# Patient Record
Sex: Male | Born: 1935 | Race: White | Hispanic: No | Smoking: Former smoker
Health system: Southern US, Community
[De-identification: ages and names within clinical notes are randomized; demographics above are authoritative.]

## PROBLEM LIST (undated history)

## (undated) DIAGNOSIS — I10 Essential (primary) hypertension: Secondary | ICD-10-CM

## (undated) HISTORY — PX: APPENDECTOMY: SHX54

## (undated) HISTORY — PX: PROSTATECTOMY: SHX69

## (undated) HISTORY — PX: TONSILLECTOMY: SUR1361

## (undated) HISTORY — DX: Essential (primary) hypertension: I10

---

## 2013-05-30 DIAGNOSIS — H9190 Unspecified hearing loss, unspecified ear: Secondary | ICD-10-CM

## 2013-05-30 DIAGNOSIS — C61 Malignant neoplasm of prostate: Secondary | ICD-10-CM | POA: Diagnosis present

## 2013-05-30 DIAGNOSIS — I639 Cerebral infarction, unspecified: Secondary | ICD-10-CM | POA: Diagnosis present

## 2014-02-20 DIAGNOSIS — R627 Adult failure to thrive: Secondary | ICD-10-CM | POA: Diagnosis present

## 2015-02-18 DIAGNOSIS — R42 Dizziness and giddiness: Secondary | ICD-10-CM | POA: Diagnosis not present

## 2015-02-18 DIAGNOSIS — R001 Bradycardia, unspecified: Secondary | ICD-10-CM | POA: Diagnosis not present

## 2015-02-18 DIAGNOSIS — D72829 Elevated white blood cell count, unspecified: Secondary | ICD-10-CM | POA: Diagnosis not present

## 2015-02-18 DIAGNOSIS — I10 Essential (primary) hypertension: Secondary | ICD-10-CM | POA: Diagnosis not present

## 2015-02-18 DIAGNOSIS — D649 Anemia, unspecified: Secondary | ICD-10-CM | POA: Diagnosis not present

## 2015-02-18 DIAGNOSIS — R55 Syncope and collapse: Secondary | ICD-10-CM | POA: Diagnosis not present

## 2015-02-18 DIAGNOSIS — R002 Palpitations: Secondary | ICD-10-CM | POA: Diagnosis not present

## 2015-02-19 DIAGNOSIS — I10 Essential (primary) hypertension: Secondary | ICD-10-CM | POA: Diagnosis not present

## 2015-02-19 DIAGNOSIS — D649 Anemia, unspecified: Secondary | ICD-10-CM | POA: Diagnosis not present

## 2015-02-19 DIAGNOSIS — R42 Dizziness and giddiness: Secondary | ICD-10-CM | POA: Diagnosis not present

## 2015-02-19 DIAGNOSIS — R001 Bradycardia, unspecified: Secondary | ICD-10-CM | POA: Diagnosis not present

## 2015-05-10 DIAGNOSIS — H93291 Other abnormal auditory perceptions, right ear: Secondary | ICD-10-CM | POA: Diagnosis not present

## 2015-05-10 DIAGNOSIS — H6123 Impacted cerumen, bilateral: Secondary | ICD-10-CM | POA: Diagnosis not present

## 2015-07-04 DIAGNOSIS — Z733 Stress, not elsewhere classified: Secondary | ICD-10-CM | POA: Diagnosis not present

## 2015-07-04 DIAGNOSIS — I10 Essential (primary) hypertension: Secondary | ICD-10-CM | POA: Diagnosis not present

## 2015-07-17 DIAGNOSIS — F411 Generalized anxiety disorder: Secondary | ICD-10-CM | POA: Diagnosis not present

## 2015-07-17 DIAGNOSIS — I10 Essential (primary) hypertension: Secondary | ICD-10-CM | POA: Diagnosis not present

## 2016-01-16 DIAGNOSIS — M7501 Adhesive capsulitis of right shoulder: Secondary | ICD-10-CM | POA: Diagnosis not present

## 2016-01-16 DIAGNOSIS — I10 Essential (primary) hypertension: Secondary | ICD-10-CM | POA: Diagnosis not present

## 2016-01-16 DIAGNOSIS — M19019 Primary osteoarthritis, unspecified shoulder: Secondary | ICD-10-CM | POA: Diagnosis not present

## 2016-01-16 DIAGNOSIS — R6889 Other general symptoms and signs: Secondary | ICD-10-CM | POA: Diagnosis not present

## 2016-02-11 DIAGNOSIS — D649 Anemia, unspecified: Secondary | ICD-10-CM | POA: Diagnosis not present

## 2016-02-24 DIAGNOSIS — J069 Acute upper respiratory infection, unspecified: Secondary | ICD-10-CM | POA: Diagnosis not present

## 2016-02-24 DIAGNOSIS — I10 Essential (primary) hypertension: Secondary | ICD-10-CM | POA: Diagnosis not present

## 2016-02-24 DIAGNOSIS — R05 Cough: Secondary | ICD-10-CM | POA: Diagnosis not present

## 2016-02-24 DIAGNOSIS — R55 Syncope and collapse: Secondary | ICD-10-CM | POA: Diagnosis not present

## 2016-02-24 DIAGNOSIS — Z79899 Other long term (current) drug therapy: Secondary | ICD-10-CM | POA: Diagnosis not present

## 2016-02-25 DIAGNOSIS — R509 Fever, unspecified: Secondary | ICD-10-CM | POA: Diagnosis not present

## 2016-03-24 DIAGNOSIS — D649 Anemia, unspecified: Secondary | ICD-10-CM | POA: Diagnosis not present

## 2016-03-24 DIAGNOSIS — R509 Fever, unspecified: Secondary | ICD-10-CM | POA: Diagnosis not present

## 2016-11-10 DIAGNOSIS — I10 Essential (primary) hypertension: Secondary | ICD-10-CM | POA: Diagnosis not present

## 2016-11-10 DIAGNOSIS — Z23 Encounter for immunization: Secondary | ICD-10-CM | POA: Diagnosis not present

## 2016-11-10 DIAGNOSIS — F411 Generalized anxiety disorder: Secondary | ICD-10-CM | POA: Diagnosis not present

## 2016-11-10 DIAGNOSIS — Z681 Body mass index (BMI) 19 or less, adult: Secondary | ICD-10-CM | POA: Diagnosis not present

## 2016-12-17 DIAGNOSIS — Z681 Body mass index (BMI) 19 or less, adult: Secondary | ICD-10-CM | POA: Diagnosis not present

## 2016-12-17 DIAGNOSIS — R636 Underweight: Secondary | ICD-10-CM | POA: Diagnosis not present

## 2016-12-17 DIAGNOSIS — I10 Essential (primary) hypertension: Secondary | ICD-10-CM | POA: Diagnosis not present

## 2016-12-18 DIAGNOSIS — R040 Epistaxis: Secondary | ICD-10-CM | POA: Diagnosis not present

## 2016-12-18 DIAGNOSIS — Z681 Body mass index (BMI) 19 or less, adult: Secondary | ICD-10-CM | POA: Diagnosis not present

## 2016-12-18 DIAGNOSIS — I10 Essential (primary) hypertension: Secondary | ICD-10-CM | POA: Diagnosis not present

## 2016-12-28 DIAGNOSIS — R040 Epistaxis: Secondary | ICD-10-CM | POA: Diagnosis not present

## 2016-12-28 DIAGNOSIS — J342 Deviated nasal septum: Secondary | ICD-10-CM | POA: Diagnosis not present

## 2017-02-15 DIAGNOSIS — Z681 Body mass index (BMI) 19 or less, adult: Secondary | ICD-10-CM | POA: Diagnosis not present

## 2017-02-15 DIAGNOSIS — Z1389 Encounter for screening for other disorder: Secondary | ICD-10-CM | POA: Diagnosis not present

## 2017-02-15 DIAGNOSIS — R040 Epistaxis: Secondary | ICD-10-CM | POA: Diagnosis not present

## 2017-02-15 DIAGNOSIS — I10 Essential (primary) hypertension: Secondary | ICD-10-CM | POA: Diagnosis not present

## 2017-03-17 DIAGNOSIS — I10 Essential (primary) hypertension: Secondary | ICD-10-CM | POA: Diagnosis not present

## 2017-03-17 DIAGNOSIS — D539 Nutritional anemia, unspecified: Secondary | ICD-10-CM | POA: Diagnosis not present

## 2017-03-17 DIAGNOSIS — R609 Edema, unspecified: Secondary | ICD-10-CM | POA: Diagnosis not present

## 2017-03-17 DIAGNOSIS — D649 Anemia, unspecified: Secondary | ICD-10-CM | POA: Diagnosis not present

## 2017-03-17 DIAGNOSIS — R7309 Other abnormal glucose: Secondary | ICD-10-CM | POA: Diagnosis not present

## 2017-03-17 DIAGNOSIS — R42 Dizziness and giddiness: Secondary | ICD-10-CM | POA: Diagnosis not present

## 2017-04-01 DIAGNOSIS — I10 Essential (primary) hypertension: Secondary | ICD-10-CM | POA: Diagnosis not present

## 2017-04-01 DIAGNOSIS — R609 Edema, unspecified: Secondary | ICD-10-CM | POA: Diagnosis not present

## 2017-04-19 DIAGNOSIS — D649 Anemia, unspecified: Secondary | ICD-10-CM | POA: Diagnosis not present

## 2017-07-05 DIAGNOSIS — H9190 Unspecified hearing loss, unspecified ear: Secondary | ICD-10-CM | POA: Diagnosis not present

## 2017-07-05 DIAGNOSIS — D649 Anemia, unspecified: Secondary | ICD-10-CM | POA: Diagnosis not present

## 2017-07-05 DIAGNOSIS — R42 Dizziness and giddiness: Secondary | ICD-10-CM | POA: Diagnosis not present

## 2017-07-05 DIAGNOSIS — I10 Essential (primary) hypertension: Secondary | ICD-10-CM | POA: Diagnosis not present

## 2017-07-05 DIAGNOSIS — R55 Syncope and collapse: Secondary | ICD-10-CM | POA: Diagnosis not present

## 2017-07-14 ENCOUNTER — Encounter: Payer: Self-pay | Admitting: Neurology

## 2017-07-14 ENCOUNTER — Ambulatory Visit (INDEPENDENT_AMBULATORY_CARE_PROVIDER_SITE_OTHER): Payer: Medicare Other | Admitting: Neurology

## 2017-07-14 VITALS — BP 181/74 | HR 71 | Ht 65.0 in | Wt 126.0 lb

## 2017-07-14 DIAGNOSIS — R030 Elevated blood-pressure reading, without diagnosis of hypertension: Secondary | ICD-10-CM | POA: Diagnosis not present

## 2017-07-14 DIAGNOSIS — R42 Dizziness and giddiness: Secondary | ICD-10-CM

## 2017-07-14 NOTE — Progress Notes (Signed)
Subjective:    Patient ID: [name] is a 84 y.o. male.  HPI     Star Age, MD, PhD [facility] [address] [place], [place] [address]  Dear Dr. Orland Mustard,   I saw your patient, Tyler Liu, upon your kind request in my neurologic clinic today for initial consultation of his lightheadedness and presyncopal spells. The patient is unaccompanied today. As you know, Tyler Liu is a 84 year old right-handed gentleman with an underlying medical history of hypertension, hearing loss and anemia, who reports episodes of lightheadedness and spinning sensation recently. Symptoms are intermittent, started when he moved to [place] from [place] about 5 months ago. Sadly, he lost his wife about 5 months ago, she was in memory care. He had moved into assisted living to be close to her, there were no spots in independent living he explains. He has been tracking his blood pressure at home. He brings in the record today. He has had fluctuating blood pressure values but diastolic numbers are typically in the higher 50s and pulse rate has been in the higher in mid 50s typically. He reports difficulty with lightheadedness and spinning sensation especially if he tries to move his head quickly or his eyes quickly and also when he tries to play the piano. He has been under a lot of stress. I reviewed your office note from 07/05/2017. He had blood work through your office which I reviewed including TSH, CMP, CBC with differential. Labs were unremarkable with the exception of hemoglobin of 12.5 and hematocrit of 37.7. He had an EKG in your office. He had a low pulse rate in your office at 45. He has lower diastolic blood pressure values in the 50s. He recently moved from [place]. He is widowed, nonsmoker, drinks alcohol rarely, does not typically consume any caffeine on a regular basis.  Patient has been upset as he was waiting a long time in our lobby and also had gone  to the wrong entrance initially when coming here. He found the instructions to get to our clinic confusing. He has not had an eye exam in some time, unclear how long. He does not want to stay in [place] as nothing keeps him here. He has no children. He does not overtly endorse depression but is clearly upset. He states at one point that he may go home and take about 40 pills of his clonidine and go to sleep and not wake up again. I did ask him about suicidal intent and suicidal ideations. Patient said that he was joking and that he would not take his clonidine like that. He does not even take it as needed typically. He has as needed Valium but does not take it typically. He was trying to see a [employer], I believe for an eye exam, but found the directions too confusing. He denies any one-sided weakness or numbness or tingling or headaches. No recent falls.  His Past Medical History Is Significant For: Past Medical History:  Diagnosis Date  . Hypertension     His Past Surgical History Is Significant For:   His Family History Is Significant For: No family history on file.  His Social History Is Significant For: Social History   Socioeconomic History  . Marital status: Widowed    Spouse name: Not on file  . Number of children: Not on file  . Years of education: Not on file  . Highest education level: Not on file  Occupational History  .  Not on file  Social Needs  . Financial resource strain: Not on file  . Food insecurity:    Worry: Not on file    Inability: Not on file  . Transportation needs:    Medical: Not on file    Non-medical: Not on file  Tobacco Use  . Smoking status: Former Research scientist (life sciences)  . Smokeless tobacco: Never Used  Substance and Sexual Activity  . Alcohol use: Yes  . Drug use: Never  . Sexual activity: Not on file  Lifestyle  . Physical activity:    Days per week: Not on file    Minutes per session: Not on file  . Stress: Not on file  Relationships  . Social  connections:    Talks on phone: Not on file    Gets together: Not on file    Attends religious service: Not on file    Active member of club or organization: Not on file    Attends meetings of clubs or organizations: Not on file    Relationship status: Not on file  Other Topics Concern  . Not on file  Social History Narrative  . Not on file    His Allergies Are:  Allergies  Allergen Reactions  . Aspirin   :   His Current Medications Are:  Outpatient Encounter Medications as of 07/14/2017  Medication Sig  . amLODipine (NORVASC) 5 MG tablet Take 5 mg by mouth daily.  Marland Kitchen atenolol (TENORMIN) 50 MG tablet Take 50 mg by mouth daily.  . Cholecalciferol (VITAMIN D3) 1000 units CAPS Take 1,000 Units by mouth daily.  . cloNIDine (CATAPRES) 0.1 MG tablet Take 0.1 mg by mouth as needed.   . Coenzyme Q10 60 MG TABS Take by mouth.  . diazePAM (VALIUM PO) Take by mouth.  Marland Kitchen GARLIC PO Take by mouth.  . Ginger, Zingiber officinalis, (GINGER ROOT PO) Take by mouth.  . Multiple Vitamins-Minerals (ICAPS AREDS FORMULA PO) Take by mouth.  . Omega 3 1200 MG CAPS Take by mouth.  . vitamin B-12 (CYANOCOBALAMIN) 1000 MCG tablet Take 1,000 mcg by mouth daily.   No facility-administered encounter medications on file as of 07/14/2017.   : Review of Systems:  Out of a complete 14 point review of systems, all are reviewed and negative with the exception of these symptoms as listed below:  Review of Systems  Neurological:       Pt presents today to discuss his dizziness. Pt gets dizzy when he plays the piano. Pt could not tolerate lying down to obtain orthostatic blood pressure today.     Objective:  Neurological Exam  Physical Exam Physical Examination:   Vitals:   07/14/17 1454  BP: (!) 181/74  Pulse: 71   Could not tolerate orthostatic testing and would not lie down.  Upon standing, BP and pulse about the same.  Later rechecked showed elevated BP again.   General Examination: The patient is  a 84 y.o. male, he had to wait for this appointment and is visibly upset and says so. He is well groomed, thin and frail appearing. He voiced his frustration repeatedly.   HEENT: Normocephalic, atraumatic, pupils are equal, round and reactive to light and accommodation. S/p cataract repairs. Extraocular tracking is good without limitation to gaze excursion or nystagmus noted. Normal smooth pursuit is noted. Hearing is grossly intact. Face is symmetric with normal facial animation and normal facial sensation. Speech is clear with no dysarthria noted. There is no hypophonia. There is no lip, neck/head, jaw  or voice tremor. Oropharynx exam reveals: mild mouth dryness, adequate dental hygiene. Tongue protrudes centrally and palate elevates symmetrically.   Chest: Clear to auscultation without wheezing, rhonchi or crackles noted.  Heart: S1+S2+0, regular and normal without murmurs, rubs or gallops noted.   Abdomen: Soft, non-tender and non-distended with normal bowel sounds appreciated on auscultation.  Extremities: There is 1+ to 2+ pitting edema in the distal lower extremities bilaterally, around the ankles, L more than R.  Skin: Warm and dry without trophic changes noted.  Musculoskeletal: exam reveals no obvious joint deformities, tenderness or joint swelling or erythema.   Neurologically:  Mental status: The patient is awake, alert and oriented in all 4 spheres. His immediate and remote memory, attention, language skills and fund of knowledge are appropriate. There is no evidence of aphasia, agnosia, apraxia or anomia. Speech is clear with normal prosody and enunciation. Thought process is linear. Mood is constricted and affect is constricted.  Cranial nerves II - XII are as described above under HEENT exam. In addition: shoulder shrug is normal with equal shoulder height noted. Motor exam: Normal bulk, strength and tone is noted. There is no drift, tremor or rebound. Romberg is negative. Reflexes  are 1+, absent in the ankles. Fine motor skills and coordination: grossly intact.  Cerebellar testing: No dysmetria or intention tremor. There is no truncal or gait ataxia.  Sensory exam: intact to light touch in the upper and lower extremities.  Gait, station and balance: He stands with difficulty and pushes himself, posture mildly stooped. Gait shows cautious and slow gait, preserved arm swing.               Assessment and Plan:    In summary, Tyler Liu is a very pleasant 84 y.o.-year old male with an underlying medical history of hypertension, hearing loss and anemia, who presents for neurologic consultation of his intermittent dizzy spells, some lightheadedness reported, some vertiginous spells, none of which could be reproduced in clinic today. He was visibly upset and unhappy with having to wait and could not go through with the orthostatic vital signs. His BP is elevated repeatedly, he does not have any focal neurological signs or symptoms. He will not be able to tolerate an MRI as he indicates that he cannot lay still for any prolonged period of time. He states, he could not tolerate the EEG laying down in your office.he may benefit from evaluation through cardiology as he may have bradycardia episodes as his pulse rate tends to be in the 50s on his log and also in your office. I suggested we could do a head CT but he would like to discuss this with you first. He will follow-up with me on an as-needed basis. He indicated frustration with life and stress. He does not endorse frank depression or suicidal ideations but may benefit from counseling or therapy. He may benefit from physical therapy. It sounds like he is participating in some form of yoga at his assisted living facility. Unfortunately, I did not have any further suggestions for him. I encouraged him to stay well-hydrated. I encouraged him to use his cane. He has a cane that he uses in the mornings typically. He also held onto his wife's  walker and wheelchair but does not typically use them. At this juncture, he will make a follow-up appointment with you. He is encouraged to see an eye doctor for an updated eye exam. I will see him back as needed. I answered all his questions today  and he was in agreement.   Thank you very much for allowing me to participate in the care of this nice patient. If I can be of any further assistance to you please do not hesitate to call me at [phone].  Sincerely,   Star Age, MD, PhD

## 2017-07-14 NOTE — Patient Instructions (Signed)
I am sorry you feel bad and dizzy.  Unfortunately, dizziness is a very common complaint but is often not due to a primary neurological reason or single underlying medical problem. Often, there a combination of factors, that result in dizziness. This includes blood pressure fluctuations, medication side effects, blood sugar fluctuations, stress, vertigo, poor sleep with sleep deprivation, dehydration, and electrolyte disturbance or other metabolic and endocrinological reasons, meaning hormone related problems such as thyroid dysfunction. We can investigate things further with a head CT. You can discuss this with Dr. Orland Mustard first.  A brain MRI would require that you lie flat for an extended period of time. You may benefit from seeing a cardiologist as your pulse rate may drop.  You may benefit from physical therapy, talk to Dr. Orland Mustard about it. I will see you back as needed.

## 2017-10-23 DIAGNOSIS — I10 Essential (primary) hypertension: Secondary | ICD-10-CM | POA: Diagnosis not present

## 2017-11-30 DIAGNOSIS — H35033 Hypertensive retinopathy, bilateral: Secondary | ICD-10-CM | POA: Diagnosis not present

## 2017-11-30 DIAGNOSIS — H0100A Unspecified blepharitis right eye, upper and lower eyelids: Secondary | ICD-10-CM | POA: Diagnosis not present

## 2017-11-30 DIAGNOSIS — H52201 Unspecified astigmatism, right eye: Secondary | ICD-10-CM | POA: Diagnosis not present

## 2017-11-30 DIAGNOSIS — Z961 Presence of intraocular lens: Secondary | ICD-10-CM | POA: Diagnosis not present

## 2018-02-09 DIAGNOSIS — H35033 Hypertensive retinopathy, bilateral: Secondary | ICD-10-CM | POA: Diagnosis not present

## 2018-02-09 DIAGNOSIS — H5340 Unspecified visual field defects: Secondary | ICD-10-CM | POA: Diagnosis not present

## 2018-03-22 DIAGNOSIS — I10 Essential (primary) hypertension: Secondary | ICD-10-CM | POA: Diagnosis not present

## 2018-03-22 DIAGNOSIS — R42 Dizziness and giddiness: Secondary | ICD-10-CM | POA: Diagnosis not present

## 2018-03-22 DIAGNOSIS — D649 Anemia, unspecified: Secondary | ICD-10-CM | POA: Diagnosis not present

## 2018-09-21 DIAGNOSIS — R2689 Other abnormalities of gait and mobility: Secondary | ICD-10-CM | POA: Diagnosis not present

## 2018-09-21 DIAGNOSIS — E46 Unspecified protein-calorie malnutrition: Secondary | ICD-10-CM | POA: Diagnosis not present

## 2018-09-21 DIAGNOSIS — D649 Anemia, unspecified: Secondary | ICD-10-CM | POA: Diagnosis not present

## 2018-09-21 DIAGNOSIS — M545 Low back pain: Secondary | ICD-10-CM | POA: Diagnosis not present

## 2018-09-21 DIAGNOSIS — R627 Adult failure to thrive: Secondary | ICD-10-CM | POA: Diagnosis not present

## 2018-09-21 DIAGNOSIS — R7309 Other abnormal glucose: Secondary | ICD-10-CM | POA: Diagnosis not present

## 2018-09-21 DIAGNOSIS — R634 Abnormal weight loss: Secondary | ICD-10-CM | POA: Diagnosis not present

## 2018-09-21 DIAGNOSIS — I10 Essential (primary) hypertension: Secondary | ICD-10-CM | POA: Diagnosis not present

## 2019-01-30 DIAGNOSIS — Z20828 Contact with and (suspected) exposure to other viral communicable diseases: Secondary | ICD-10-CM | POA: Diagnosis not present

## 2019-01-30 DIAGNOSIS — Z1159 Encounter for screening for other viral diseases: Secondary | ICD-10-CM | POA: Diagnosis not present

## 2019-02-06 DIAGNOSIS — Z20828 Contact with and (suspected) exposure to other viral communicable diseases: Secondary | ICD-10-CM | POA: Diagnosis not present

## 2019-02-06 DIAGNOSIS — Z1159 Encounter for screening for other viral diseases: Secondary | ICD-10-CM | POA: Diagnosis not present

## 2019-02-13 DIAGNOSIS — Z20828 Contact with and (suspected) exposure to other viral communicable diseases: Secondary | ICD-10-CM | POA: Diagnosis not present

## 2019-02-13 DIAGNOSIS — Z1159 Encounter for screening for other viral diseases: Secondary | ICD-10-CM | POA: Diagnosis not present

## 2019-04-05 DIAGNOSIS — D649 Anemia, unspecified: Secondary | ICD-10-CM | POA: Diagnosis not present

## 2019-04-05 DIAGNOSIS — R202 Paresthesia of skin: Secondary | ICD-10-CM | POA: Diagnosis not present

## 2019-04-05 DIAGNOSIS — R7309 Other abnormal glucose: Secondary | ICD-10-CM | POA: Diagnosis not present

## 2019-04-05 DIAGNOSIS — I10 Essential (primary) hypertension: Secondary | ICD-10-CM | POA: Diagnosis not present

## 2019-04-05 DIAGNOSIS — M549 Dorsalgia, unspecified: Secondary | ICD-10-CM | POA: Diagnosis not present

## 2019-10-02 DIAGNOSIS — Z1159 Encounter for screening for other viral diseases: Secondary | ICD-10-CM | POA: Diagnosis not present

## 2019-10-02 DIAGNOSIS — Z20828 Contact with and (suspected) exposure to other viral communicable diseases: Secondary | ICD-10-CM | POA: Diagnosis not present

## 2019-12-01 ENCOUNTER — Other Ambulatory Visit: Payer: Self-pay

## 2019-12-01 ENCOUNTER — Emergency Department (HOSPITAL_COMMUNITY): Payer: Medicare Other

## 2019-12-01 ENCOUNTER — Emergency Department (HOSPITAL_COMMUNITY)
Admission: EM | Admit: 2019-12-01 | Discharge: 2019-12-01 | Disposition: A | Discharge status: Home / Self Care | Payer: Medicare Other | Attending: Emergency Medicine | Admitting: Emergency Medicine

## 2019-12-01 ENCOUNTER — Encounter (HOSPITAL_COMMUNITY): Payer: Self-pay | Admitting: Emergency Medicine

## 2019-12-01 DIAGNOSIS — M545 Low back pain, unspecified: Secondary | ICD-10-CM | POA: Insufficient documentation

## 2019-12-01 DIAGNOSIS — L89159 Pressure ulcer of sacral region, unspecified stage: Secondary | ICD-10-CM | POA: Diagnosis not present

## 2019-12-01 DIAGNOSIS — K5730 Diverticulosis of large intestine without perforation or abscess without bleeding: Secondary | ICD-10-CM | POA: Diagnosis not present

## 2019-12-01 DIAGNOSIS — Z87891 Personal history of nicotine dependence: Secondary | ICD-10-CM | POA: Diagnosis not present

## 2019-12-01 DIAGNOSIS — I10 Essential (primary) hypertension: Secondary | ICD-10-CM | POA: Diagnosis not present

## 2019-12-01 DIAGNOSIS — K8689 Other specified diseases of pancreas: Secondary | ICD-10-CM | POA: Diagnosis not present

## 2019-12-01 DIAGNOSIS — M549 Dorsalgia, unspecified: Secondary | ICD-10-CM

## 2019-12-01 DIAGNOSIS — R10819 Abdominal tenderness, unspecified site: Secondary | ICD-10-CM | POA: Diagnosis not present

## 2019-12-01 DIAGNOSIS — N3289 Other specified disorders of bladder: Secondary | ICD-10-CM | POA: Diagnosis not present

## 2019-12-01 DIAGNOSIS — Z79899 Other long term (current) drug therapy: Secondary | ICD-10-CM | POA: Diagnosis not present

## 2019-12-01 LAB — COMPREHENSIVE METABOLIC PANEL
ALT: 10 U/L (ref 0–44)
AST: 16 U/L (ref 15–41)
Albumin: 4.2 g/dL (ref 3.5–5.0)
Alkaline Phosphatase: 74 U/L (ref 38–126)
Anion gap: 10 (ref 5–15)
BUN: 26 mg/dL — ABNORMAL HIGH (ref 8–23)
CO2: 28 mmol/L (ref 22–32)
Calcium: 10.3 mg/dL (ref 8.9–10.3)
Chloride: 100 mmol/L (ref 98–111)
Creatinine, Ser: 0.99 mg/dL (ref 0.61–1.24)
GFR, Estimated: >60 mL/min (ref >60)
Glucose, Bld: 112 mg/dL — ABNORMAL HIGH (ref 70–99)
Potassium: 4.1 mmol/L (ref 3.5–5.1)
Sodium: 138 mmol/L (ref 135–145)
Total Bilirubin: 0.8 mg/dL (ref 0.3–1.2)
Total Protein: 7.9 g/dL (ref 6.5–8.1)

## 2019-12-01 LAB — CBC WITH DIFFERENTIAL/PLATELET
Abs Immature Granulocytes: 0.04 10*3/uL (ref 0.00–0.07)
Basophils Absolute: 0 10*3/uL (ref 0.0–0.1)
Basophils Relative: 0 %
Eosinophils Absolute: 0.2 10*3/uL (ref 0.0–0.5)
Eosinophils Relative: 2 %
HCT: 36.5 % — ABNORMAL LOW (ref 39.0–52.0)
Hemoglobin: 11.9 g/dL — ABNORMAL LOW (ref 13.0–17.0)
Immature Granulocytes: 0 %
Lymphocytes Relative: 17 %
Lymphs Abs: 1.8 10*3/uL (ref 0.7–4.0)
MCH: 29.9 pg (ref 26.0–34.0)
MCHC: 32.6 g/dL (ref 30.0–36.0)
MCV: 91.7 fL (ref 80.0–100.0)
Monocytes Absolute: 0.9 10*3/uL (ref 0.1–1.0)
Monocytes Relative: 8 %
Neutro Abs: 7.9 10*3/uL — ABNORMAL HIGH (ref 1.7–7.7)
Neutrophils Relative %: 73 %
Platelets: 223 10*3/uL (ref 150–400)
RBC: 3.98 MIL/uL — ABNORMAL LOW (ref 4.22–5.81)
RDW: 14 % (ref 11.5–15.5)
WBC: 10.8 10*3/uL — ABNORMAL HIGH (ref 4.0–10.5)
nRBC: 0 % (ref 0.0–0.2)

## 2019-12-01 LAB — URINALYSIS, ROUTINE W REFLEX MICROSCOPIC
Bacteria, UA: NONE SEEN
Bilirubin Urine: NEGATIVE
Glucose, UA: NEGATIVE mg/dL
Hgb urine dipstick: NEGATIVE
Ketones, ur: 5 mg/dL — AB
Leukocytes,Ua: NEGATIVE
Nitrite: NEGATIVE
Protein, ur: 100 mg/dL — AB
Specific Gravity, Urine: 1.012 (ref 1.005–1.030)
pH: 6 (ref 5.0–8.0)

## 2019-12-01 MED ORDER — ACETAMINOPHEN 325 MG PO TABS
650.0000 mg | ORAL_TABLET | Freq: Once | ORAL | Status: AC
  Administered 2019-12-01: 650 mg via ORAL
  Filled 2019-12-01: qty 2

## 2019-12-01 MED ORDER — METHYLPREDNISOLONE 4 MG PO TBPK: ORAL_TABLET | ORAL | 0 refills | Status: DC

## 2019-12-01 MED ORDER — DIAZEPAM 5 MG/ML IJ SOLN
2.5000 mg | Freq: Once | INTRAMUSCULAR | Status: AC
  Administered 2019-12-01: 2.5 mg via INTRAVENOUS
  Filled 2019-12-01: qty 2

## 2019-12-01 MED ORDER — LIDOCAINE 5 % EX PTCH
1.0000 | MEDICATED_PATCH | CUTANEOUS | Status: DC
  Administered 2019-12-01: 1 via TRANSDERMAL
  Filled 2019-12-01: qty 1

## 2019-12-01 MED ORDER — MORPHINE SULFATE (PF) 4 MG/ML IV SOLN
4.0000 mg | Freq: Once | INTRAVENOUS | Status: AC
  Administered 2019-12-01: 4 mg via INTRAVENOUS
  Filled 2019-12-01: qty 1

## 2019-12-01 MED ORDER — CYCLOBENZAPRINE HCL 5 MG PO TABS: 10.0000 mg | ORAL_TABLET | Freq: Two times a day (BID) | ORAL | 0 refills | Status: DC | PRN

## 2019-12-01 MED ORDER — KETOROLAC TROMETHAMINE 15 MG/ML IJ SOLN
15.0000 mg | Freq: Once | INTRAMUSCULAR | Status: AC
  Administered 2019-12-01: 15 mg via INTRAVENOUS
  Filled 2019-12-01: qty 1

## 2019-12-01 NOTE — ED Notes (Signed)
Pt in CT at this time.

## 2019-12-01 NOTE — Progress Notes (Signed)
TOC CM/CSW spoke with [facility] about pts return.  [facility] does accept pt back.  [facility] is partnered with [facility] for [facility] [phone], and [place]/PT [phone].  CSW will continue to work with Fuller Mandril, RN to make arrangements.  Nevaen Tredway Tarpley-Carter, MSW, LCSW-A Pronouns:  She, Her, Hers                  [place] ED Transitions of CareClinical Social Worker [email or web address] [phone]

## 2019-12-01 NOTE — TOC Initial Note (Addendum)
Transition of Care [facility]) - Initial/Assessment Note    Patient Details  Name: Tyler Liu MRN: [id number] Date of Birth: March 12, 1922  Transition of Care [facility]) CM/SW Contact:    Tyler Liu, [place] Phone Number: 12/01/2019, 1:45 PM  Clinical Narrative:                 [facility] CM/CSW spoke with pt about his needs when returning to [facility].  Pt stated he would like PT and HH.  CSW informed pt that both CSW CM and RN CM will be working to arrange this.  Tyler Liu, MSW, LCSW-A Pronouns:  She, Her, [place] ED Transitions of CareClinical Social Worker [email or web address] [phone]        Patient Goals and CMS Choice  Arrange PT and [facility] for pts return to [place].      Expected Discharge Plan and Services                                                Prior Living Arrangements/Services  [facility]/ALF                     Activities of Daily Living      Permission Sought/Granted  Pt has given CSW CM and RN CM permission to speak with [facility] and make dc arrangements.                Emotional Assessment  Alert and oriented x 4.            Admission diagnosis:  back pain There are no problems to display for this patient.  PCP:  Patient, No Pcp Per Pharmacy:   [facility], [address] [place] [address] Phone: [phone] Fax: [phone]     Social Determinants of Health (SDOH) Interventions    Readmission Risk Interventions No flowsheet data found.

## 2019-12-01 NOTE — Discharge Instructions (Addendum)
Please take 650 mg of Tylenol every 6 hours as needed for pain as well.  Consider buying 4% lidocaine patches over-the-counter as well to use daily.  Take Flexeril and methylprednisolone as prescribed.

## 2019-12-01 NOTE — ED Provider Notes (Signed)
[facility] DEPT Provider Note   CSN: [id number] Arrival date & time: 12/01/19  1030     History Chief Complaint  Patient presents with  . Back Pain    Right side    Tyler Liu is a 86 y.o. male.  The history is provided by the patient.  Back Pain Location:  Lumbar spine Quality:  Aching and stabbing Pain severity:  Moderate Onset quality:  Gradual Timing:  Intermittent Progression:  Worsening Chronicity:  New Context: not recent illness (hx of kidney stones)   Relieved by:  Nothing Worsened by:  Movement Associated symptoms: no abdominal pain, no abdominal swelling, no bladder incontinence, no bowel incontinence, no chest pain, no dysuria, no fever, no headaches, no leg pain, no numbness, no paresthesias, no pelvic pain, no perianal numbness, no tingling, no weakness and no weight loss        Past Medical History:  Diagnosis Date  . Hypertension     There are no problems to display for this patient.   Past Surgical History:  Procedure Laterality Date  . APPENDECTOMY    . PROSTATECTOMY    . TONSILLECTOMY         History reviewed. No pertinent family history.  Social History   Tobacco Use  . Smoking status: Former Research scientist (life sciences)  . Smokeless tobacco: Never Used  Substance Use Topics  . Alcohol use: Yes  . Drug use: Never    Home Medications Prior to Admission medications   Medication Sig Start Date End Date Taking? Authorizing Provider  amLODipine (NORVASC) 5 MG tablet Take 5 mg by mouth daily.    [provider]  atenolol (TENORMIN) 50 MG tablet Take 50 mg by mouth daily.    [provider]  Cholecalciferol (VITAMIN D3) 1000 units CAPS Take 1,000 Units by mouth daily.    [provider]  cloNIDine (CATAPRES) 0.1 MG tablet Take 0.1 mg by mouth as needed.     [provider]  Coenzyme Q10 60 MG TABS Take by mouth.    [provider]  cyclobenzaprine (FLEXERIL) 5 MG tablet Take 2  tablets (10 mg total) by mouth 2 (two) times daily as needed for up to 20 doses for muscle spasms. 12/01/19   Sholom Dulude, DO  diazePAM (VALIUM PO) Take by mouth.    [provider]  GARLIC PO Take by mouth.    [provider]  Ginger, Zingiber officinalis, (GINGER ROOT PO) Take by mouth.    [provider]  methylPREDNISolone (MEDROL DOSEPAK) 4 MG TBPK tablet Follow package insert 12/01/19   Lanee Chain, DO  Multiple Vitamins-Minerals (ICAPS AREDS FORMULA PO) Take by mouth.    [provider]  Omega 3 1200 MG CAPS Take by mouth.    [provider]  vitamin B-12 (CYANOCOBALAMIN) 1000 MCG tablet Take 1,000 mcg by mouth daily.    [provider]    Allergies    Aspirin  Review of Systems   Review of Systems  Constitutional: Negative for chills, fever and weight loss.  HENT: Negative for ear pain and sore throat.   Eyes: Negative for pain and visual disturbance.  Respiratory: Negative for cough and shortness of breath.   Cardiovascular: Negative for chest pain and palpitations.  Gastrointestinal: Negative for abdominal pain, bowel incontinence and vomiting.  Genitourinary: Negative for bladder incontinence, dysuria, hematuria and pelvic pain.  Musculoskeletal: Positive for back pain. Negative for arthralgias.  Skin: Negative for color change and rash.  Neurological:  Negative for tingling, seizures, syncope, weakness, numbness, headaches and paresthesias.  All other systems reviewed and are negative.   Physical Exam Updated Vital Signs BP (!) 145/59   Pulse 64   Temp 97.7 F (36.5 C) (Oral)   Resp 17   SpO2 94%   Physical Exam Vitals and nursing note reviewed.  Constitutional:      General: He is in acute distress.     Appearance: He is well-developed.  HENT:     Head: Normocephalic and atraumatic.     Nose: Nose normal.     Mouth/Throat:     Mouth: Mucous membranes are moist.  Eyes:     Extraocular Movements:  Extraocular movements intact.     Conjunctiva/sclera: Conjunctivae normal.     Pupils: Pupils are equal, round, and reactive to light.  Cardiovascular:     Rate and Rhythm: Normal rate and regular rhythm.     Pulses: Normal pulses.     Heart sounds: Normal heart sounds. No murmur heard.   Pulmonary:     Effort: Pulmonary effort is normal. No respiratory distress.     Breath sounds: Normal breath sounds.  Abdominal:     Palpations: Abdomen is soft.     Tenderness: There is no abdominal tenderness. There is right CVA tenderness.  Musculoskeletal:        General: Tenderness present. Normal range of motion.     Cervical back: Normal range of motion and neck supple.     Comments: Tenderness to right flank area and right paraspinal lumbar muscles on the right  Skin:    General: Skin is warm and dry.     Capillary Refill: Capillary refill takes less than 2 seconds.  Neurological:     General: No focal deficit present.     Mental Status: He is alert and oriented to person, place, and time.     Cranial Nerves: No cranial nerve deficit.     Sensory: No sensory deficit.     Motor: No weakness.     Coordination: Coordination normal.     Comments: 5+ out of 5 strength throughout, normal sensation     ED Results / Procedures / Treatments   Labs (all labs ordered are listed, but only abnormal results are displayed) Labs Reviewed  CBC WITH DIFFERENTIAL/PLATELET - Abnormal; Notable for the following components:      Result Value   WBC 10.8 (*)    RBC 3.98 (*)    Hemoglobin 11.9 (*)    HCT 36.5 (*)    Neutro Abs 7.9 (*)    All other components within normal limits  COMPREHENSIVE METABOLIC PANEL - Abnormal; Notable for the following components:   Glucose, Bld 112 (*)    BUN 26 (*)    All other components within normal limits  URINALYSIS, ROUTINE W REFLEX MICROSCOPIC - Abnormal; Notable for the following components:   Ketones, ur 5 (*)    Protein, ur 100 (*)    All other components  within normal limits    EKG None  Radiology CT L-SPINE NO CHARGE  Result Date: 12/01/2019 CLINICAL DATA:  Right-sided low back pain for 1 month. Increasing pain with movement. EXAM: CT LUMBAR SPINE WITHOUT CONTRAST TECHNIQUE: Multiplanar CT image reconstructions of the lumbar spine were generated from the data acquired during abdominal CT same date. COMPARISON:  None. FINDINGS: Abdominopelvic CT findings dictated separately. Segmentation: Transitional lumbosacral anatomy. Presumed vestigial right-sided rib at T12 and transitional, partially lumbarized L5 segment. Alignment: Normal. Vertebrae:  The bones are demineralized. No evidence of acute fracture or dislocation. There is endplate degeneration with Schmorl's node formation at L3-4. No endplate destruction. There are syndesmophytes and paraspinal osteophytes throughout the lumbar spine with probable ankylosis of the L4-5 and L5-S1 facet joints. The sacroiliac joints also appear partially ankylosed bilaterally. Paraspinal and other soft tissues: No acute paraspinal findings. Abdominopelvic findings deferred to separate report. Disc levels: L1-2: Mild disc bulging and facet hypertrophy. No significant spinal stenosis. L2-3: Mild disc bulging and bilateral facet hypertrophy. No significant spinal stenosis. L3-4: As above, disc degeneration with prominent Schmorl's node and endplate osteophyte formation. There is a small central partially calcified disc protrusion and mild facet and ligamentous hypertrophy. These factors contribute to mild spinal stenosis and mild asymmetric narrowing of the right lateral recess and right foramen. L4-5: Suspected ankylosis across the disc and facet joints. No significant spinal stenosis. L5-S1: Suspected ankylosis across the disc and facet joints. No significant spinal stenosis. As above, this level is transitional with bilateral lumbosacral assimilation joints. IMPRESSION: 1. No acute lumbar spine findings or explanation  for the patient's symptoms. 2. Transitional lumbosacral anatomy. Suspected ankylosis across the L4-5 and L5-S1 facet joints and sacroiliac joints suggesting ankylosing spondylitis. 3. Multilevel spondylosis as described, greatest at L3-4 where there is mild multifactorial spinal stenosis and mild asymmetric narrowing of the right lateral recess and right foramen. 4. Abdominopelvic CT findings dictated separately. Electronically Signed   By: Richardean Sale M.D.   On: 12/01/2019 12:24   CT Renal Stone Study  Result Date: 12/01/2019 CLINICAL DATA:  Flank pain, suspected kidney stone with RIGHT flank pain EXAM: CT ABDOMEN AND PELVIS WITHOUT CONTRAST TECHNIQUE: Multidetector CT imaging of the abdomen and pelvis was performed following the standard protocol without IV contrast. COMPARISON:  CT lumbar spine same date. FINDINGS: Lower chest: Bilateral Bochdalek's hernias with adjacent atelectasis. No consolidation. No pleural effusion. Hepatobiliary: Normal noncontrast appearance of liver. Cholelithiasis without pericholecystic stranding. Biliary tree not well evaluated, no gross biliary duct distension. Pancreas: Fatty atrophy of much of the pancreas without signs of inflammation. Atrophy is present throughout the entire pancreas. No visible ductal dilation. Spleen: Spleen is normal. Adrenals/Urinary Tract: Adrenal glands are normal. Cystic lesions of the bilateral kidneys most displaying density compatible with simple cysts. The lobulated lesion along the lateral margin of the RIGHT kidney measures 13-14 mm with density just below 30 Hounsfield units, homogeneous density. No sign of hydronephrosis. No perinephric stranding. Urinary bladder moderately distended. Lack of ureteral distension makes ureteral assessment difficult. No gross evidence of ureteral calculi and no secondary signs to suggest ureteral calculi. Stomach/Bowel: Signs of colonic diverticulosis. Mild stranding about the RIGHT colon in the setting of  RIGHT-sided diverticulosis. Appendix not visualized, surgically absent by report. Vascular/Lymphatic: Calcified atheromatous plaque of the abdominal aorta. The 2.7 cm caliber of the infrarenal abdominal aorta is slightly greater than 1.5 times the normal caliber abdominal aorta. Not well evaluated in the absence of contrast. There is no gastrohepatic or hepatoduodenal ligament lymphadenopathy. No retroperitoneal or mesenteric lymphadenopathy. No pelvic sidewall lymphadenopathy. Reproductive: Post prostatectomy. Post pelvic lymphadenectomy. No pelvic mass. Other: Mild stranding overlying the sacrum. No CT evidence of ulceration (image 55 of series 2) Musculoskeletal: Marked osteopenia. Spinal degenerative changes. No acute or destructive bone process. IMPRESSION: 1. Colonic diverticulosis including RIGHT-sided diverticular disease. Query low level inflammatory changes of the RIGHT colon which could represent mild diverticulitis. 2. No hydronephrosis or nephrolithiasis. No periureteral stranding to suggest ureteral calculi. Limited assessment due to difficulty  visualizing the course of the ureter the setting of post prostatectomy changes and lack of retroperitoneal fat. 3. Cholelithiasis without evidence of acute cholecystitis. 4. Cystic lesions of the bilateral kidneys most displaying density compatible with simple cysts. The lobulated lesion along the lateral margin of the RIGHT kidney measures 13-14 mm with density just below 30 Hounsfield units, homogeneous density. Follow-up renal sonogram may be helpful for further evaluation in 3-6 months. 5. Mild stranding overlying the sacrum. No CT evidence of ulceration. Correlate with any symptoms of sacral decubitus ulcer. 6. 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 Recommend follow-up every 5 years. This recommendation follows ACR consensus guidelines: White Paper of the ACR Incidental Findings Committee II on Vascular Findings. J Am Coll Radiol 2013;  10:789-794. 7. Aortic atherosclerosis. Aortic Atherosclerosis (ICD10-I70.0). Electronically Signed   By: Zetta Bills M.D.   On: 12/01/2019 12:25    Procedures Procedures (including critical care time)  Medications Ordered in ED Medications  lidocaine (LIDODERM) 5 % 1 patch (1 patch Transdermal Patch Applied 12/01/19 1319)  morphine 4 MG/ML injection 4 mg (4 mg Intravenous Given 12/01/19 1137)  diazepam (VALIUM) injection 2.5 mg (2.5 mg Intravenous Given 12/01/19 1322)  ketorolac (TORADOL) 15 MG/ML injection 15 mg (15 mg Intravenous Given 12/01/19 1320)  acetaminophen (TYLENOL) tablet 650 mg (650 mg Oral Given 12/01/19 1319)    ED Course  I have reviewed the triage vital signs and the nursing notes.  Pertinent labs & imaging results that were available during my care of the patient were reviewed by me and considered in my medical decision making (see chart for details).    MDM Rules/Calculators/A&P                          Tyler Liu is a 86 year old male with history of hypertension who presents the ED with rate lower back/right flank pain.  History of kidney stones as well.  Normal vitals.  No fever.  Has had some lower back pain for the last several weeks but got more severe this morning.  Worse when he moves.  Denies any loss of bowel or bladder.  Denies any numbness or tingling or weakness in his lower legs.  No history of back surgeries.  Lives by himself at home and uses a walker but too painful to move.  Has not noticed any blood in his urine.  Does not think it is a kidney stone.  He appears very uncomfortable.  Has no midline spinal pain.  Has tenderness in the right flank and paraspinal muscles of the right lumbar area.  Will get CT renal study and CT scan of the lumbar spine as well as basic labs and urinalysis.  Will give IV morphine and reevaluate.  Suspect likely musculoskeletal process.  But will evaluate for kidney stone or fracture.  Has good pulses equally in bilateral  in his feet.  Low suspicion for AAA as patient does not have any abdominal pain.  Patient with no significant anemia, leukocytosis, electrolyte abnormality.  No kidney injury.  CT scan of the abdomen and pelvis and lumbar spine are overall unremarkable.  No kidney stones.  Patient did have some diverticular changes in the right side but not having really focal tenderness in the abdomen, no fever, no white count and doubt colitis.  He does not have any sacral decubitus ulcer.  Overall no fracture.  Does have some chronic disc disease and overall suspect MSK source for  discomfort.  Does not have any concerning features for cauda equina.  Feeling better after IV morphine and IV Valium, Tylenol, Toradol, lidocaine patch.  Does live an independent living facility and trying to get him more resources for home health and support as he is a fall risk given the amount of pain that he is in.  Social work and case management are involved to talk to patient about different options.  Patient has had home health and PT services arranged at his independent living facility.  Will discharge with Medrol Dosepak, Flexeril prescriptions.  Educated about lidocaine and Tylenol use.  This chart was dictated using voice recognition software.  Despite best efforts to proofread,  errors can occur which can change the documentation meaning.     Final Clinical Impression(s) / ED Diagnoses Final diagnoses:  Back pain    Rx / DC Orders ED Discharge Orders         Ordered    methylPREDNISolone (MEDROL DOSEPAK) 4 MG TBPK tablet        12/01/19 1441    cyclobenzaprine (FLEXERIL) 5 MG tablet  2 times daily PRN        12/01/19 1441           Chabely Norby, Quita Skye, DO 12/01/19 1441

## 2019-12-01 NOTE — ED Triage Notes (Signed)
BIBA Per EMS: Pt coming from [facility] Complaints of lower r back pain X1 month that has been worsening over time.  Gets worse with movement  Vitals: 164/71 72 HR 14 RR 95% room air 117 CBG 97.9 temp

## 2019-12-04 DIAGNOSIS — Z1159 Encounter for screening for other viral diseases: Secondary | ICD-10-CM | POA: Diagnosis not present

## 2019-12-04 DIAGNOSIS — Z20828 Contact with and (suspected) exposure to other viral communicable diseases: Secondary | ICD-10-CM | POA: Diagnosis not present

## 2019-12-06 DIAGNOSIS — Z87442 Personal history of urinary calculi: Secondary | ICD-10-CM | POA: Diagnosis not present

## 2019-12-06 DIAGNOSIS — Z87891 Personal history of nicotine dependence: Secondary | ICD-10-CM | POA: Diagnosis not present

## 2019-12-06 DIAGNOSIS — Z9089 Acquired absence of other organs: Secondary | ICD-10-CM | POA: Diagnosis not present

## 2019-12-06 DIAGNOSIS — I10 Essential (primary) hypertension: Secondary | ICD-10-CM | POA: Diagnosis not present

## 2019-12-06 DIAGNOSIS — Z9181 History of falling: Secondary | ICD-10-CM | POA: Diagnosis not present

## 2019-12-06 DIAGNOSIS — M48061 Spinal stenosis, lumbar region without neurogenic claudication: Secondary | ICD-10-CM | POA: Diagnosis not present

## 2019-12-06 DIAGNOSIS — M479 Spondylosis, unspecified: Secondary | ICD-10-CM | POA: Diagnosis not present

## 2019-12-06 DIAGNOSIS — Z9049 Acquired absence of other specified parts of digestive tract: Secondary | ICD-10-CM | POA: Diagnosis not present

## 2019-12-08 DIAGNOSIS — M479 Spondylosis, unspecified: Secondary | ICD-10-CM | POA: Diagnosis not present

## 2019-12-08 DIAGNOSIS — M48061 Spinal stenosis, lumbar region without neurogenic claudication: Secondary | ICD-10-CM | POA: Diagnosis not present

## 2019-12-08 DIAGNOSIS — Z87442 Personal history of urinary calculi: Secondary | ICD-10-CM | POA: Diagnosis not present

## 2019-12-08 DIAGNOSIS — Z9089 Acquired absence of other organs: Secondary | ICD-10-CM | POA: Diagnosis not present

## 2019-12-08 DIAGNOSIS — Z9049 Acquired absence of other specified parts of digestive tract: Secondary | ICD-10-CM | POA: Diagnosis not present

## 2019-12-08 DIAGNOSIS — I10 Essential (primary) hypertension: Secondary | ICD-10-CM | POA: Diagnosis not present

## 2019-12-11 DIAGNOSIS — Z1159 Encounter for screening for other viral diseases: Secondary | ICD-10-CM | POA: Diagnosis not present

## 2019-12-11 DIAGNOSIS — Z20828 Contact with and (suspected) exposure to other viral communicable diseases: Secondary | ICD-10-CM | POA: Diagnosis not present

## 2019-12-12 DIAGNOSIS — I10 Essential (primary) hypertension: Secondary | ICD-10-CM | POA: Diagnosis not present

## 2019-12-12 DIAGNOSIS — Z87442 Personal history of urinary calculi: Secondary | ICD-10-CM | POA: Diagnosis not present

## 2019-12-12 DIAGNOSIS — Z9089 Acquired absence of other organs: Secondary | ICD-10-CM | POA: Diagnosis not present

## 2019-12-12 DIAGNOSIS — Z9049 Acquired absence of other specified parts of digestive tract: Secondary | ICD-10-CM | POA: Diagnosis not present

## 2019-12-12 DIAGNOSIS — M479 Spondylosis, unspecified: Secondary | ICD-10-CM | POA: Diagnosis not present

## 2019-12-12 DIAGNOSIS — M48061 Spinal stenosis, lumbar region without neurogenic claudication: Secondary | ICD-10-CM | POA: Diagnosis not present

## 2019-12-13 DIAGNOSIS — Z9049 Acquired absence of other specified parts of digestive tract: Secondary | ICD-10-CM | POA: Diagnosis not present

## 2019-12-13 DIAGNOSIS — I10 Essential (primary) hypertension: Secondary | ICD-10-CM | POA: Diagnosis not present

## 2019-12-13 DIAGNOSIS — M48061 Spinal stenosis, lumbar region without neurogenic claudication: Secondary | ICD-10-CM | POA: Diagnosis not present

## 2019-12-13 DIAGNOSIS — M479 Spondylosis, unspecified: Secondary | ICD-10-CM | POA: Diagnosis not present

## 2019-12-13 DIAGNOSIS — Z87442 Personal history of urinary calculi: Secondary | ICD-10-CM | POA: Diagnosis not present

## 2019-12-13 DIAGNOSIS — Z9089 Acquired absence of other organs: Secondary | ICD-10-CM | POA: Diagnosis not present

## 2019-12-15 DIAGNOSIS — I10 Essential (primary) hypertension: Secondary | ICD-10-CM | POA: Diagnosis not present

## 2019-12-15 DIAGNOSIS — Z9049 Acquired absence of other specified parts of digestive tract: Secondary | ICD-10-CM | POA: Diagnosis not present

## 2019-12-15 DIAGNOSIS — M48061 Spinal stenosis, lumbar region without neurogenic claudication: Secondary | ICD-10-CM | POA: Diagnosis not present

## 2019-12-15 DIAGNOSIS — M479 Spondylosis, unspecified: Secondary | ICD-10-CM | POA: Diagnosis not present

## 2019-12-15 DIAGNOSIS — Z87442 Personal history of urinary calculi: Secondary | ICD-10-CM | POA: Diagnosis not present

## 2019-12-15 DIAGNOSIS — Z9089 Acquired absence of other organs: Secondary | ICD-10-CM | POA: Diagnosis not present

## 2019-12-18 DIAGNOSIS — Z20828 Contact with and (suspected) exposure to other viral communicable diseases: Secondary | ICD-10-CM | POA: Diagnosis not present

## 2019-12-18 DIAGNOSIS — Z1159 Encounter for screening for other viral diseases: Secondary | ICD-10-CM | POA: Diagnosis not present

## 2019-12-25 DIAGNOSIS — Z1159 Encounter for screening for other viral diseases: Secondary | ICD-10-CM | POA: Diagnosis not present

## 2019-12-25 DIAGNOSIS — Z20828 Contact with and (suspected) exposure to other viral communicable diseases: Secondary | ICD-10-CM | POA: Diagnosis not present

## 2020-01-08 DIAGNOSIS — Z1159 Encounter for screening for other viral diseases: Secondary | ICD-10-CM | POA: Diagnosis not present

## 2020-01-08 DIAGNOSIS — Z20828 Contact with and (suspected) exposure to other viral communicable diseases: Secondary | ICD-10-CM | POA: Diagnosis not present

## 2020-01-15 DIAGNOSIS — Z1159 Encounter for screening for other viral diseases: Secondary | ICD-10-CM | POA: Diagnosis not present

## 2020-01-15 DIAGNOSIS — Z20828 Contact with and (suspected) exposure to other viral communicable diseases: Secondary | ICD-10-CM | POA: Diagnosis not present

## 2020-01-22 DIAGNOSIS — Z1159 Encounter for screening for other viral diseases: Secondary | ICD-10-CM | POA: Diagnosis not present

## 2020-01-22 DIAGNOSIS — Z20828 Contact with and (suspected) exposure to other viral communicable diseases: Secondary | ICD-10-CM | POA: Diagnosis not present

## 2020-02-05 DIAGNOSIS — Z20828 Contact with and (suspected) exposure to other viral communicable diseases: Secondary | ICD-10-CM | POA: Diagnosis not present

## 2020-02-05 DIAGNOSIS — Z1159 Encounter for screening for other viral diseases: Secondary | ICD-10-CM | POA: Diagnosis not present

## 2020-02-12 DIAGNOSIS — Z20828 Contact with and (suspected) exposure to other viral communicable diseases: Secondary | ICD-10-CM | POA: Diagnosis not present

## 2020-02-12 DIAGNOSIS — Z1159 Encounter for screening for other viral diseases: Secondary | ICD-10-CM | POA: Diagnosis not present

## 2020-02-19 DIAGNOSIS — Z1159 Encounter for screening for other viral diseases: Secondary | ICD-10-CM | POA: Diagnosis not present

## 2020-02-19 DIAGNOSIS — Z20828 Contact with and (suspected) exposure to other viral communicable diseases: Secondary | ICD-10-CM | POA: Diagnosis not present

## 2020-02-28 DIAGNOSIS — M549 Dorsalgia, unspecified: Secondary | ICD-10-CM | POA: Diagnosis not present

## 2020-02-28 DIAGNOSIS — R7309 Other abnormal glucose: Secondary | ICD-10-CM | POA: Diagnosis not present

## 2020-02-28 DIAGNOSIS — D649 Anemia, unspecified: Secondary | ICD-10-CM | POA: Diagnosis not present

## 2020-02-28 DIAGNOSIS — R634 Abnormal weight loss: Secondary | ICD-10-CM | POA: Diagnosis not present

## 2020-02-28 DIAGNOSIS — I10 Essential (primary) hypertension: Secondary | ICD-10-CM | POA: Diagnosis not present

## 2020-03-11 DIAGNOSIS — Z1159 Encounter for screening for other viral diseases: Secondary | ICD-10-CM | POA: Diagnosis not present

## 2020-03-11 DIAGNOSIS — Z20828 Contact with and (suspected) exposure to other viral communicable diseases: Secondary | ICD-10-CM | POA: Diagnosis not present

## 2020-03-18 DIAGNOSIS — Z20828 Contact with and (suspected) exposure to other viral communicable diseases: Secondary | ICD-10-CM | POA: Diagnosis not present

## 2020-03-18 DIAGNOSIS — Z1159 Encounter for screening for other viral diseases: Secondary | ICD-10-CM | POA: Diagnosis not present

## 2020-03-25 DIAGNOSIS — Z20828 Contact with and (suspected) exposure to other viral communicable diseases: Secondary | ICD-10-CM | POA: Diagnosis not present

## 2020-03-25 DIAGNOSIS — Z1159 Encounter for screening for other viral diseases: Secondary | ICD-10-CM | POA: Diagnosis not present

## 2020-04-01 DIAGNOSIS — Z20828 Contact with and (suspected) exposure to other viral communicable diseases: Secondary | ICD-10-CM | POA: Diagnosis not present

## 2020-04-01 DIAGNOSIS — Z1159 Encounter for screening for other viral diseases: Secondary | ICD-10-CM | POA: Diagnosis not present

## 2020-04-15 DIAGNOSIS — Z20828 Contact with and (suspected) exposure to other viral communicable diseases: Secondary | ICD-10-CM | POA: Diagnosis not present

## 2020-04-15 DIAGNOSIS — Z1159 Encounter for screening for other viral diseases: Secondary | ICD-10-CM | POA: Diagnosis not present

## 2020-04-22 DIAGNOSIS — Z1159 Encounter for screening for other viral diseases: Secondary | ICD-10-CM | POA: Diagnosis not present

## 2020-04-22 DIAGNOSIS — Z20828 Contact with and (suspected) exposure to other viral communicable diseases: Secondary | ICD-10-CM | POA: Diagnosis not present

## 2020-04-29 DIAGNOSIS — Z20828 Contact with and (suspected) exposure to other viral communicable diseases: Secondary | ICD-10-CM | POA: Diagnosis not present

## 2020-04-29 DIAGNOSIS — Z1159 Encounter for screening for other viral diseases: Secondary | ICD-10-CM | POA: Diagnosis not present

## 2020-05-06 DIAGNOSIS — Z1159 Encounter for screening for other viral diseases: Secondary | ICD-10-CM | POA: Diagnosis not present

## 2020-05-06 DIAGNOSIS — Z20828 Contact with and (suspected) exposure to other viral communicable diseases: Secondary | ICD-10-CM | POA: Diagnosis not present

## 2020-05-13 DIAGNOSIS — Z1159 Encounter for screening for other viral diseases: Secondary | ICD-10-CM | POA: Diagnosis not present

## 2020-05-13 DIAGNOSIS — Z20828 Contact with and (suspected) exposure to other viral communicable diseases: Secondary | ICD-10-CM | POA: Diagnosis not present

## 2020-05-20 DIAGNOSIS — Z1159 Encounter for screening for other viral diseases: Secondary | ICD-10-CM | POA: Diagnosis not present

## 2020-05-20 DIAGNOSIS — Z20828 Contact with and (suspected) exposure to other viral communicable diseases: Secondary | ICD-10-CM | POA: Diagnosis not present

## 2020-05-27 DIAGNOSIS — Z1159 Encounter for screening for other viral diseases: Secondary | ICD-10-CM | POA: Diagnosis not present

## 2020-05-27 DIAGNOSIS — Z20828 Contact with and (suspected) exposure to other viral communicable diseases: Secondary | ICD-10-CM | POA: Diagnosis not present

## 2020-06-03 DIAGNOSIS — Z20828 Contact with and (suspected) exposure to other viral communicable diseases: Secondary | ICD-10-CM | POA: Diagnosis not present

## 2020-06-03 DIAGNOSIS — Z1159 Encounter for screening for other viral diseases: Secondary | ICD-10-CM | POA: Diagnosis not present

## 2020-06-10 DIAGNOSIS — Z20828 Contact with and (suspected) exposure to other viral communicable diseases: Secondary | ICD-10-CM | POA: Diagnosis not present

## 2020-06-10 DIAGNOSIS — Z1159 Encounter for screening for other viral diseases: Secondary | ICD-10-CM | POA: Diagnosis not present

## 2020-06-17 DIAGNOSIS — Z20828 Contact with and (suspected) exposure to other viral communicable diseases: Secondary | ICD-10-CM | POA: Diagnosis not present

## 2020-06-17 DIAGNOSIS — Z1159 Encounter for screening for other viral diseases: Secondary | ICD-10-CM | POA: Diagnosis not present

## 2020-06-24 DIAGNOSIS — Z1159 Encounter for screening for other viral diseases: Secondary | ICD-10-CM | POA: Diagnosis not present

## 2020-06-24 DIAGNOSIS — Z20828 Contact with and (suspected) exposure to other viral communicable diseases: Secondary | ICD-10-CM | POA: Diagnosis not present

## 2020-07-01 DIAGNOSIS — Z1159 Encounter for screening for other viral diseases: Secondary | ICD-10-CM | POA: Diagnosis not present

## 2020-07-01 DIAGNOSIS — Z20828 Contact with and (suspected) exposure to other viral communicable diseases: Secondary | ICD-10-CM | POA: Diagnosis not present

## 2020-07-08 DIAGNOSIS — Z1159 Encounter for screening for other viral diseases: Secondary | ICD-10-CM | POA: Diagnosis not present

## 2020-07-08 DIAGNOSIS — Z20828 Contact with and (suspected) exposure to other viral communicable diseases: Secondary | ICD-10-CM | POA: Diagnosis not present

## 2020-07-15 DIAGNOSIS — Z20828 Contact with and (suspected) exposure to other viral communicable diseases: Secondary | ICD-10-CM | POA: Diagnosis not present

## 2020-07-15 DIAGNOSIS — Z1159 Encounter for screening for other viral diseases: Secondary | ICD-10-CM | POA: Diagnosis not present

## 2020-07-22 DIAGNOSIS — Z1159 Encounter for screening for other viral diseases: Secondary | ICD-10-CM | POA: Diagnosis not present

## 2020-07-22 DIAGNOSIS — Z20828 Contact with and (suspected) exposure to other viral communicable diseases: Secondary | ICD-10-CM | POA: Diagnosis not present

## 2020-08-01 DIAGNOSIS — Z1159 Encounter for screening for other viral diseases: Secondary | ICD-10-CM | POA: Diagnosis not present

## 2020-08-01 DIAGNOSIS — Z20828 Contact with and (suspected) exposure to other viral communicable diseases: Secondary | ICD-10-CM | POA: Diagnosis not present

## 2020-08-05 DIAGNOSIS — Z20828 Contact with and (suspected) exposure to other viral communicable diseases: Secondary | ICD-10-CM | POA: Diagnosis not present

## 2020-08-05 DIAGNOSIS — Z1159 Encounter for screening for other viral diseases: Secondary | ICD-10-CM | POA: Diagnosis not present

## 2020-08-12 DIAGNOSIS — Z1159 Encounter for screening for other viral diseases: Secondary | ICD-10-CM | POA: Diagnosis not present

## 2020-08-12 DIAGNOSIS — Z20828 Contact with and (suspected) exposure to other viral communicable diseases: Secondary | ICD-10-CM | POA: Diagnosis not present

## 2020-08-19 DIAGNOSIS — Z20828 Contact with and (suspected) exposure to other viral communicable diseases: Secondary | ICD-10-CM | POA: Diagnosis not present

## 2020-08-19 DIAGNOSIS — Z1159 Encounter for screening for other viral diseases: Secondary | ICD-10-CM | POA: Diagnosis not present

## 2020-08-26 DIAGNOSIS — Z1159 Encounter for screening for other viral diseases: Secondary | ICD-10-CM | POA: Diagnosis not present

## 2020-08-26 DIAGNOSIS — Z20828 Contact with and (suspected) exposure to other viral communicable diseases: Secondary | ICD-10-CM | POA: Diagnosis not present

## 2020-09-02 DIAGNOSIS — Z20828 Contact with and (suspected) exposure to other viral communicable diseases: Secondary | ICD-10-CM | POA: Diagnosis not present

## 2020-09-02 DIAGNOSIS — Z1159 Encounter for screening for other viral diseases: Secondary | ICD-10-CM | POA: Diagnosis not present

## 2020-10-01 DIAGNOSIS — R7309 Other abnormal glucose: Secondary | ICD-10-CM | POA: Diagnosis not present

## 2020-10-01 DIAGNOSIS — R634 Abnormal weight loss: Secondary | ICD-10-CM | POA: Diagnosis not present

## 2020-10-01 DIAGNOSIS — R609 Edema, unspecified: Secondary | ICD-10-CM | POA: Diagnosis not present

## 2020-10-01 DIAGNOSIS — F39 Unspecified mood [affective] disorder: Secondary | ICD-10-CM | POA: Diagnosis not present

## 2020-10-01 DIAGNOSIS — E46 Unspecified protein-calorie malnutrition: Secondary | ICD-10-CM | POA: Diagnosis not present

## 2020-10-01 DIAGNOSIS — I10 Essential (primary) hypertension: Secondary | ICD-10-CM | POA: Diagnosis not present

## 2020-11-12 ENCOUNTER — Emergency Department (HOSPITAL_COMMUNITY): Payer: Medicare Other

## 2020-11-12 ENCOUNTER — Encounter (HOSPITAL_COMMUNITY): Payer: Self-pay

## 2020-11-12 ENCOUNTER — Inpatient Hospital Stay (HOSPITAL_COMMUNITY)
Admission: EM | Admit: 2020-11-12 | Discharge: 2020-12-03 | DRG: 682 | Disposition: E | Payer: Medicare Other | Attending: Family Medicine | Admitting: Family Medicine

## 2020-11-12 ENCOUNTER — Other Ambulatory Visit: Payer: Self-pay

## 2020-11-12 DIAGNOSIS — Z886 Allergy status to analgesic agent status: Secondary | ICD-10-CM

## 2020-11-12 DIAGNOSIS — R000 Tachycardia, unspecified: Secondary | ICD-10-CM | POA: Diagnosis not present

## 2020-11-12 DIAGNOSIS — N179 Acute kidney failure, unspecified: Secondary | ICD-10-CM | POA: Diagnosis not present

## 2020-11-12 DIAGNOSIS — R7401 Elevation of levels of liver transaminase levels: Secondary | ICD-10-CM | POA: Diagnosis present

## 2020-11-12 DIAGNOSIS — R64 Cachexia: Secondary | ICD-10-CM | POA: Diagnosis present

## 2020-11-12 DIAGNOSIS — Z66 Do not resuscitate: Secondary | ICD-10-CM | POA: Diagnosis present

## 2020-11-12 DIAGNOSIS — G9341 Metabolic encephalopathy: Secondary | ICD-10-CM | POA: Diagnosis present

## 2020-11-12 DIAGNOSIS — I10 Essential (primary) hypertension: Secondary | ICD-10-CM | POA: Diagnosis present

## 2020-11-12 DIAGNOSIS — F411 Generalized anxiety disorder: Secondary | ICD-10-CM | POA: Diagnosis present

## 2020-11-12 DIAGNOSIS — C61 Malignant neoplasm of prostate: Secondary | ICD-10-CM | POA: Diagnosis present

## 2020-11-12 DIAGNOSIS — R778 Other specified abnormalities of plasma proteins: Secondary | ICD-10-CM | POA: Diagnosis not present

## 2020-11-12 DIAGNOSIS — R7989 Other specified abnormal findings of blood chemistry: Secondary | ICD-10-CM

## 2020-11-12 DIAGNOSIS — Z515 Encounter for palliative care: Secondary | ICD-10-CM

## 2020-11-12 DIAGNOSIS — Z20822 Contact with and (suspected) exposure to covid-19: Secondary | ICD-10-CM | POA: Diagnosis not present

## 2020-11-12 DIAGNOSIS — E860 Dehydration: Secondary | ICD-10-CM | POA: Diagnosis not present

## 2020-11-12 DIAGNOSIS — R0689 Other abnormalities of breathing: Secondary | ICD-10-CM | POA: Diagnosis not present

## 2020-11-12 DIAGNOSIS — R4182 Altered mental status, unspecified: Secondary | ICD-10-CM

## 2020-11-12 DIAGNOSIS — E43 Unspecified severe protein-calorie malnutrition: Secondary | ICD-10-CM | POA: Diagnosis present

## 2020-11-12 DIAGNOSIS — R627 Adult failure to thrive: Secondary | ICD-10-CM | POA: Diagnosis present

## 2020-11-12 DIAGNOSIS — I252 Old myocardial infarction: Secondary | ICD-10-CM

## 2020-11-12 DIAGNOSIS — H9190 Unspecified hearing loss, unspecified ear: Secondary | ICD-10-CM

## 2020-11-12 DIAGNOSIS — I639 Cerebral infarction, unspecified: Secondary | ICD-10-CM | POA: Diagnosis present

## 2020-11-12 DIAGNOSIS — Z8673 Personal history of transient ischemic attack (TIA), and cerebral infarction without residual deficits: Secondary | ICD-10-CM

## 2020-11-12 DIAGNOSIS — I248 Other forms of acute ischemic heart disease: Secondary | ICD-10-CM | POA: Diagnosis present

## 2020-11-12 DIAGNOSIS — R404 Transient alteration of awareness: Secondary | ICD-10-CM | POA: Diagnosis not present

## 2020-11-12 DIAGNOSIS — Z6820 Body mass index (BMI) 20.0-20.9, adult: Secondary | ICD-10-CM

## 2020-11-12 DIAGNOSIS — D638 Anemia in other chronic diseases classified elsewhere: Secondary | ICD-10-CM | POA: Diagnosis present

## 2020-11-12 DIAGNOSIS — J439 Emphysema, unspecified: Secondary | ICD-10-CM | POA: Diagnosis not present

## 2020-11-12 DIAGNOSIS — Z79899 Other long term (current) drug therapy: Secondary | ICD-10-CM

## 2020-11-12 DIAGNOSIS — Z8546 Personal history of malignant neoplasm of prostate: Secondary | ICD-10-CM

## 2020-11-12 DIAGNOSIS — Z87891 Personal history of nicotine dependence: Secondary | ICD-10-CM

## 2020-11-12 DIAGNOSIS — E785 Hyperlipidemia, unspecified: Secondary | ICD-10-CM | POA: Diagnosis present

## 2020-11-12 DIAGNOSIS — R0902 Hypoxemia: Secondary | ICD-10-CM | POA: Diagnosis not present

## 2020-11-12 LAB — COMPREHENSIVE METABOLIC PANEL
ALT: 34 U/L (ref 0–44)
AST: 71 U/L — ABNORMAL HIGH (ref 15–41)
Albumin: 3.3 g/dL — ABNORMAL LOW (ref 3.5–5.0)
Alkaline Phosphatase: 61 U/L (ref 38–126)
Anion gap: 15 (ref 5–15)
BUN: 80 mg/dL — ABNORMAL HIGH (ref 8–23)
CO2: 26 mmol/L (ref 22–32)
Calcium: 10.5 mg/dL — ABNORMAL HIGH (ref 8.9–10.3)
Chloride: 103 mmol/L (ref 98–111)
Creatinine, Ser: 2.1 mg/dL — ABNORMAL HIGH (ref 0.61–1.24)
GFR, Estimated: 28 mL/min — ABNORMAL LOW (ref >60)
Glucose, Bld: 108 mg/dL — ABNORMAL HIGH (ref 70–99)
Potassium: 5.3 mmol/L — ABNORMAL HIGH (ref 3.5–5.1)
Sodium: 144 mmol/L (ref 135–145)
Total Bilirubin: 0.8 mg/dL (ref 0.3–1.2)
Total Protein: 6.2 g/dL — ABNORMAL LOW (ref 6.5–8.1)

## 2020-11-12 LAB — I-STAT VENOUS BLOOD GAS, ED
Acid-Base Excess: 5 mmol/L — ABNORMAL HIGH (ref 0.0–2.0)
Bicarbonate: 33.1 mmol/L — ABNORMAL HIGH (ref 20.0–28.0)
Calcium, Ion: 1.3 mmol/L (ref 1.15–1.40)
HCT: 33 % — ABNORMAL LOW (ref 39.0–52.0)
Hemoglobin: 11.2 g/dL — ABNORMAL LOW (ref 13.0–17.0)
O2 Saturation: 63 %
Potassium: 5 mmol/L (ref 3.5–5.1)
Sodium: 142 mmol/L (ref 135–145)
TCO2: 35 mmol/L — ABNORMAL HIGH (ref 22–32)
pCO2, Ven: 63.5 mmHg — ABNORMAL HIGH (ref 44.0–60.0)
pH, Ven: 7.325 (ref 7.250–7.430)
pO2, Ven: 36 mmHg (ref 32.0–45.0)

## 2020-11-12 LAB — CBC WITH DIFFERENTIAL/PLATELET
Abs Immature Granulocytes: 0.04 10*3/uL (ref 0.00–0.07)
Basophils Absolute: 0 10*3/uL (ref 0.0–0.1)
Basophils Relative: 0 %
Eosinophils Absolute: 0 10*3/uL (ref 0.0–0.5)
Eosinophils Relative: 0 %
HCT: 35.3 % — ABNORMAL LOW (ref 39.0–52.0)
Hemoglobin: 10.7 g/dL — ABNORMAL LOW (ref 13.0–17.0)
Immature Granulocytes: 0 %
Lymphocytes Relative: 5 %
Lymphs Abs: 0.5 10*3/uL — ABNORMAL LOW (ref 0.7–4.0)
MCH: 31.2 pg (ref 26.0–34.0)
MCHC: 30.3 g/dL (ref 30.0–36.0)
MCV: 102.9 fL — ABNORMAL HIGH (ref 80.0–100.0)
Monocytes Absolute: 1.2 10*3/uL — ABNORMAL HIGH (ref 0.1–1.0)
Monocytes Relative: 12 %
Neutro Abs: 8.7 10*3/uL — ABNORMAL HIGH (ref 1.7–7.7)
Neutrophils Relative %: 83 %
Platelets: 175 10*3/uL (ref 150–400)
RBC: 3.43 MIL/uL — ABNORMAL LOW (ref 4.22–5.81)
RDW: 14.8 % (ref 11.5–15.5)
WBC: 10.5 10*3/uL (ref 4.0–10.5)
nRBC: 0 % (ref 0.0–0.2)

## 2020-11-12 LAB — URINALYSIS, ROUTINE W REFLEX MICROSCOPIC
Bilirubin Urine: NEGATIVE
Glucose, UA: NEGATIVE mg/dL
Ketones, ur: 5 mg/dL — AB
Leukocytes,Ua: NEGATIVE
Nitrite: NEGATIVE
Protein, ur: 100 mg/dL — AB
RBC / HPF: >50 RBC/hpf — ABNORMAL HIGH (ref 0–5)
Specific Gravity, Urine: 1.016 (ref 1.005–1.030)
pH: 5 (ref 5.0–8.0)

## 2020-11-12 LAB — TROPONIN I (HIGH SENSITIVITY)
Troponin I (High Sensitivity): 129 ng/L (ref <18)
Troponin I (High Sensitivity): 142 ng/L (ref <18)

## 2020-11-12 LAB — BRAIN NATRIURETIC PEPTIDE: B Natriuretic Peptide: 237 pg/mL — ABNORMAL HIGH (ref 0.0–100.0)

## 2020-11-12 LAB — RESP PANEL BY RT-PCR (FLU A&B, COVID) ARPGX2
Influenza A by PCR: NEGATIVE
Influenza B by PCR: NEGATIVE
SARS Coronavirus 2 by RT PCR: NEGATIVE

## 2020-11-12 LAB — AMMONIA: Ammonia: <10 umol/L (ref 9–35)

## 2020-11-12 MED ORDER — ACETAMINOPHEN 325 MG PO TABS: 650.0000 mg | ORAL_TABLET | Freq: Four times a day (QID) | ORAL | Status: DC | PRN

## 2020-11-12 MED ORDER — GLYCOPYRROLATE 1 MG PO TABS
1.0000 mg | ORAL_TABLET | ORAL | Status: DC | PRN
  Filled 2020-11-12: qty 1

## 2020-11-12 MED ORDER — SODIUM CHLORIDE 0.9 % IV BOLUS
500.0000 mL | Freq: Once | INTRAVENOUS | Status: AC
  Administered 2020-11-12: 500 mL via INTRAVENOUS

## 2020-11-12 MED ORDER — DEXTROSE-NACL 5-0.9 % IV SOLN: INTRAVENOUS | Status: DC

## 2020-11-12 MED ORDER — HYDROMORPHONE HCL 1 MG/ML IJ SOLN
0.5000 mg | INTRAMUSCULAR | Status: DC | PRN
  Administered 2020-11-14: 1 mg via INTRAVENOUS
  Filled 2020-11-12: qty 1

## 2020-11-12 MED ORDER — ONDANSETRON HCL 4 MG PO TABS: 4.0000 mg | ORAL_TABLET | Freq: Four times a day (QID) | ORAL | Status: DC | PRN

## 2020-11-12 MED ORDER — HALOPERIDOL LACTATE 5 MG/ML IJ SOLN: 0.5000 mg | INTRAMUSCULAR | Status: DC | PRN

## 2020-11-12 MED ORDER — GLYCOPYRROLATE 0.2 MG/ML IJ SOLN: 0.2000 mg | INTRAMUSCULAR | Status: DC | PRN

## 2020-11-12 MED ORDER — LORAZEPAM 1 MG PO TABS: 1.0000 mg | ORAL_TABLET | ORAL | Status: DC | PRN

## 2020-11-12 MED ORDER — BIOTENE DRY MOUTH MT LIQD: 15.0000 mL | OROMUCOSAL | Status: DC | PRN

## 2020-11-12 MED ORDER — BISACODYL 10 MG RE SUPP: 10.0000 mg | Freq: Every day | RECTAL | Status: DC | PRN

## 2020-11-12 MED ORDER — HALOPERIDOL LACTATE 2 MG/ML PO CONC
0.5000 mg | ORAL | Status: DC | PRN
  Filled 2020-11-12: qty 0.3

## 2020-11-12 MED ORDER — ACETAMINOPHEN 650 MG RE SUPP: 650.0000 mg | Freq: Four times a day (QID) | RECTAL | Status: DC | PRN

## 2020-11-12 MED ORDER — ONDANSETRON HCL 4 MG/2ML IJ SOLN: 4.0000 mg | Freq: Four times a day (QID) | INTRAMUSCULAR | Status: DC | PRN

## 2020-11-12 MED ORDER — HALOPERIDOL 0.5 MG PO TABS
0.5000 mg | ORAL_TABLET | ORAL | Status: DC | PRN
  Filled 2020-11-12: qty 1

## 2020-11-12 MED ORDER — LORAZEPAM 2 MG/ML PO CONC: 1.0000 mg | ORAL | Status: DC | PRN

## 2020-11-12 MED ORDER — POLYVINYL ALCOHOL 1.4 % OP SOLN
1.0000 [drp] | Freq: Four times a day (QID) | OPHTHALMIC | Status: DC | PRN
  Filled 2020-11-12: qty 15

## 2020-11-12 MED ORDER — ENOXAPARIN SODIUM 30 MG/0.3ML IJ SOSY: 30.0000 mg | PREFILLED_SYRINGE | INTRAMUSCULAR | Status: DC

## 2020-11-12 MED ORDER — LORAZEPAM 2 MG/ML IJ SOLN: 1.0000 mg | INTRAMUSCULAR | Status: DC | PRN

## 2020-11-12 NOTE — ED Triage Notes (Signed)
Patient arrives from [place] at [facility] (assisted living) due to altered mental status and fatigue x1 day. Per ems, the staff was making rounds and found the patient on the toilet yelling for help (unable to get up). Per staff, the patient speech is slower than normal.   Vitals with EMS:  BG 135 BP: 122/72 HR: 110 RR: 28

## 2020-11-12 NOTE — Consult Note (Signed)
Consultation Note Date: 11/13/2020   Patient Name: Tyler Liu  DOB: [birth year]  MRN: [id number]  Age / Sex: 87 y.o., male  PCP: Patient, No Pcp Per (Inactive) Referring Physician: Antonieta Pert, MD  Reason for Consultation: Establishing goals of care "[place], hospice discussion"  HPI/Patient Profile: 87 y.o. male  with past medical history of CVA, history of prostate cancer, anxiety, anemia, HTN, and hearing loss who was brought to the emergency department on 11/21/2020 with altered mental status, weakness, and failure to thrive. Today he was found as his assisted living facility on the toilet yelling for help, unable to get up, and confused from baseline. In the ED, labs are significant for creatinine of 2.10 and elevated troponin of 129>142. Head CT with no acute finding. Chest x-ray with emphysematous changes. Admitted to [facility] with acute metabolic encephalopathy.   Clinical Assessment and Goals of Care: I have reviewed medical records including EPIC notes, labs and imaging, and examined the patient at bedside in the ED. Patient is unresponsive to voice and light touch. No non-verbal signs of pain or discomfort noted. Respirations are even and unlabored. No excessive respiratory secretions noted.   I spoke with his niece/Tyler Liu to discuss diagnosis, prognosis, GOC, EOL wishes, disposition, and options. I introduced Palliative Medicine as specialized medical care for people living with serious illness. It focuses on providing relief from the symptoms and stress of a serious illness.   We discussed a brief life review of the patient. He immigrated to the [place] from [place]. 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 He and his wife retired to [place]. After his wife passed away in 05-12-2016, he relocated to [facility] and moved into [facility].   As far as functional and nutritional status, there has been  significant recent decline. Patient has been refusing all of his nutritional supplements. He was ambulatory up until last week.   We discussed his current illness and what it means in the larger context of his ongoing co-morbidities. I shared with Tyler Liu that based on my assessment, it appears patient is at EOL with prognosis of likely days but possibly hours. Natural trajectory at EOL was discussed.   I attempted to elicit values and goals of care important to the patient. Tyler Liu shares that patient has been ready to die for quite some time. He has made statements that he wants to go to heaven to be with his wife. She intends to honor his wishes to allow him to pass peacefully.    The difference between full scope medical intervention and comfort care was considered.  I recommended transition to comfort measures only, allowing a natural course to occur. Reviewed that the goal is comfort and dignity rather than prolonging life. Provided education that comfort care includes keeping him clean and dry, no labs, no artificial hydration or feeding, no antibiotics, minimizing of medications, comfort feeds, and medication for pain and dyspnea. Tyler Liu is agreeable to transition to full comfort care at this time.   Primary  decision maker: Tyler Liu (niece)  SUMMARY OF RECOMMENDATIONS   Full comfort measures initiated DNR/DNI as previously documented Added orders for symptom management at EOL as well as discontinued orders that were not focused on comfort PRN medications are available for symptom management to ensure EOL comfort PMT will continue to follow   Symptom Management:  Dilaudid 0.5-1 mg every 2 hours prn for pain or dyspnea Lorazepam (ATIVAN) prn for anxiety Haloperidol (HALDOL) prn for agitation  Glycopyrrolate (ROBINUL) for excessive secretions Ondansetron (ZOFRAN) prn for nausea Polyvinyl alcohol (LIQUIFILM TEARS) prn for dry eyes Antiseptic oral rinse (BIOTENE) prn for dry mouth   Code  Status/Advance Care Planning: DNR  Palliative Prophylaxis:  Oral Care and Turn Reposition  Additional Recommendations (Limitations, Scope, Preferences): Full Comfort Care  Psycho-social/Spiritual:  Created space and opportunity for family to express thoughts and feelings regarding patient's current medical situation.  Emotional support provided   Prognosis:  < 2 weeks  Discharge Planning: To Be Determined      Primary Diagnoses: Present on Admission:  Acute metabolic encephalopathy  AKI (acute kidney injury) ([place])  Transaminitis  Generalized anxiety disorder  Prostate cancer ([place])  Adult failure to thrive syndrome  Anemia of chronic disease  Benign essential hypertension  CVA (cerebral vascular accident) ([place])   I have reviewed the medical record, interviewed the patient and family, and examined the patient. The following aspects are pertinent.  Past Medical History:  Diagnosis Date   Hypertension      History reviewed. No pertinent family history. Scheduled Meds:  enoxaparin (LOVENOX) injection  30 mg Subcutaneous Q24H   Continuous Infusions:  dextrose 5 % and 0.9% NaCl     PRN Meds:.acetaminophen **OR** acetaminophen, bisacodyl, ondansetron **OR** ondansetron (ZOFRAN) IV   Allergies  Allergen Reactions   Aspirin    Review of Systems  Unable to perform ROS: Patient unresponsive   Physical Exam Vitals reviewed.  Constitutional:      General: He is not in acute distress.    Appearance: He is cachectic. He is ill-appearing.  Cardiovascular:     Rate and Rhythm: Normal rate.  Pulmonary:     Effort: Pulmonary effort is normal.  Neurological:     Mental Status: He is unresponsive.    Vital Signs: BP (!) 139/46   Pulse 88   Temp 97.6 F (36.4 C) (Axillary)   Resp 14   SpO2 100%  Pain Scale: PAINAD       SpO2: SpO2: 100 % O2 Device:SpO2: 100 % O2 Flow Rate: .O2 Flow Rate (L/min): 1 L/min  IO: Intake/output summary:  Intake/Output Summary  (Last 24 hours) at 12/01/2020 1824 Last data filed at 11/13/2020 1602 Gross per 24 hour  Intake --  Output 200 ml  Net -200 ml    Palliative Assessment/Data: PPS 10%     Time In: 1820 Time Out: 1931 Time Total: 71 minutes Greater than 50%  of this time was spent counseling and coordinating care related to the above assessment and plan.  Signed by: Lavena Bullion, NP   Please contact Palliative Medicine Team phone at [phone] for questions and concerns.  For individual provider: See Shea Evans

## 2020-11-12 NOTE — H&P (Signed)
History and Physical    Tyler Liu FYB:[id number] DOB: Aug 18, 1922 DOA: 11/03/2020  PCP: Patient, No Pcp Per (Inactive)   Patient coming from: [facility]  Chief Complaint  Patient presents with   Altered Mental Status   Fatigue     HPI: Tyler Liu is a 87 y.o. male with medical history significant for history of CVA, history of prostate cancer, generalized anxiety disorder, hypertension, anemia, hearing loss who lives in assisted living facility brought to the ED for evaluation of altered mental status, weakness and failure to thrive. Patient is a poor historian, history obtained from chart review, patient's niece at the bedside. Patient has been recently declining has been cachectic with failure to thrive poor intake despite trying boost Ensure supplemental drinks, patient has been refusing to supplement.  He was ambulatory until last week and has been declining.  Today he was found on the toilet yelling for help was unable to get up EMS was brought in he was also found to be confused from baseline.  ED Course: VITALS stable, labs with elevated creatinine, anemia, elevated troponin but flat 129>142, EKG sinus rhythm anteroseptal infarct old.  CT head no acute finding chest x-ray emphysematous changes. ED physician discussed with patient's niece at the bedside with only family-given patient's significant recent decline patient/family would not want any aggressive measures except overnight hydration and supportive measures to see if he improves if not then leaning towards hospice. UA pending. On exam in ER patient is obtunded unresponsive although breathing spontaneously and vital stable  Assessment/Plan  Acute metabolic encephalopathy/obtunded: Multifactorial with recent decline failure to thrive, poor intake, currently with elevated BUN/creatinine unknown baseline.  UA pending, otherwise no leukocytosis no fever chest x-ray no pneumonia.  We will gently hydrate with IV fluids,  keep on supportive care-no escalation of care as per discussion with patient's niece at the bedside.  Failure to thrive Poor oral intake Cachectic-severe protein calorie malnutrition: Provide supportive care.  He has been refusing supplement recently.  AKI: Previous creatinine 0.9 last year.  Significant elevated BUN/creatinine continue IV fluids Recent Labs    12/01/19 1120 11/20/2020 1100  BUN 26* 80*  CREATININE 0.99 2.10*    Essential hypertension History of stroke: Hold his home antihypertensive medication for now.  Elevated troponin from 129>142: Patient unable to provide any history.EKG sinus rhythm no acute ischemic changes.Suspect demand ischemia but not sure if NSTEMI but given his clinical situation not a candidate for any aggressive measures and patient's family requesting no escalation of care.  Generalized anxiety disorder: Supportive care  Prostate cancer history details not available  Anemia of chronic disease: Hemoglobin stable  Hearing loss: Supportive care  Transaminitis: Elevated LFTs.  Monitor.  Goals of care: Given patient's significant decline, advanced age multiple comorbidities and currently obtunded encephalopathy overall prognosis is extremely poor.Discussed with patient's niece at the bedside-she is aware that patient may decompensate decline overnight-family requesting supportive care palliative medicine has been consulted for end-of-life care.  Niece tells me that after his wife's death in 2016-04-28 he has been declining he has refused Zoloft has not been eating well and has made comments multiple times about meeting his wife in heaven.  Patient's nursing home record indicates emergency contact as [name] and Berneta Sages.  [name] [phone].  [name] tells me she also has a cousin Berneta Sages is in [place] who is also involving care and aware about situation is availabel at [phone].  Patient does have other nieces and nephews but not in contact/not involved in  care.  There is no height or weight on file to calculate BMI.   Severity of Illness: The appropriate patient status for this patient is OBSERVATION. Observation status is judged to be reasonable and necessary in order to provide the required intensity of service to ensure the patient's safety. The patient's presenting symptoms, physical exam findings, and initial radiographic and laboratory data in the context of their medical condition is felt to place them at decreased risk for further clinical deterioration. Furthermore, it is anticipated that the patient will be medically stable for discharge from the hospital within 2 midnights of admission. The following factors support the patient status of observation.   " The patient's presenting symptoms include altered mental status " The physical exam findings include cachectic.  DVT prophylaxis: enoxaparin (LOVENOX) injection 30 mg Start: 11/15/2020 1700 SCDs Start: 11/22/2020 1645SCD Code Status:   Code Status: DNR  Family Communication: Admission, patients condition and plan of care including tests being ordered have been discussed with patient's contact [name] who indicate understanding and agree with the plan and Code Status.  Consults called:  PMT  Review of Systems: Unable to review full system due to patient's mental status    Past Medical History:  Diagnosis Date   Hypertension     Past Surgical History:  Procedure Laterality Date   APPENDECTOMY     PROSTATECTOMY     TONSILLECTOMY       reports that he has quit smoking. He has never used smokeless tobacco. He reports current alcohol use. He reports that he does not use drugs.  Allergies  Allergen Reactions   Aspirin     History reviewed. No pertinent family history.   Prior to Admission medications   Medication Sig Start Date End Date Taking? Authorizing Provider  amLODipine (NORVASC) 5 MG tablet Take 5 mg by mouth daily.    [provider]  atenolol (TENORMIN)  25 MG tablet Take 25 mg by mouth 2 (two) times daily. 01/01/20   [provider]  Cholecalciferol (VITAMIN D3) 1000 units CAPS Take 1,000 Units by mouth daily.    [provider]  cloNIDine (CATAPRES) 0.1 MG tablet Take 0.1 mg by mouth as needed.     [provider]  Coenzyme Q10 60 MG TABS Take by mouth.    [provider]  cyclobenzaprine (FLEXERIL) 5 MG tablet Take 2 tablets (10 mg total) by mouth 2 (two) times daily as needed for up to 20 doses for muscle spasms. 12/01/19   Curatolo, Adam, DO  diazePAM (VALIUM PO) Take by mouth.    [provider]  GARLIC PO Take by mouth.    [provider]  Ginger, Zingiber officinalis, (GINGER ROOT PO) Take by mouth.    [provider]  methylPREDNISolone (MEDROL DOSEPAK) 4 MG TBPK tablet Follow package insert 12/01/19   Curatolo, Adam, DO  Multiple Vitamins-Minerals (ICAPS AREDS FORMULA PO) Take by mouth.    [provider]  Omega 3 1200 MG CAPS Take by mouth.    [provider]  sertraline (ZOLOFT) 50 MG tablet Take 50 mg by mouth daily. 10/01/20   [provider]  vitamin B-12 (CYANOCOBALAMIN) 1000 MCG tablet Take 1,000 mcg by mouth daily.    [provider]    Physical Exam: Vitals:   11/23/2020 1530 11/07/2020 1545 11/21/2020 1615 12/01/2020 1645  BP: (!) 129/43 (!) 133/43 (!) 132/40 (!) 132/43  Pulse: 89 89 87 88  Resp: 15 15 14 15   Temp:  TempSrc:      SpO2: 100% 100% 100% 100%    General exam: Obtunded not responding cachectic  HEENT:Oral mucosa dry, Ear/Nose WNL grossly, dentition normal. Respiratory system: bilaterally clear,no wheezing or crackles,no use of accessory muscle Cardiovascular system: S1 & S2 +, No JVD,. Gastrointestinal system: Abdomen soft, NT,ND, BS+ Nervous System: Obtunded not moving any extremities.  Extremities: LE edema+ with some skin wrinkling, Skin: No rashes,no icterus. MSK: Small muscle bulk cachectic  Labs on  Admission: I have personally reviewed following labs and imaging studies  CBC: Recent Labs  Lab 11/09/2020 1100 11/02/2020 1103  WBC 10.5  --   NEUTROABS 8.7*  --   HGB 10.7* 11.2*  HCT 35.3* 33.0*  MCV 102.9*  --   PLT 175  --    Basic Metabolic Panel: Recent Labs  Lab 11/25/2020 1100 11/05/2020 1103  NA 144 142  K 5.3* 5.0  CL 103  --   CO2 26  --   GLUCOSE 108*  --   BUN 80*  --   CREATININE 2.10*  --   CALCIUM 10.5*  --    GFR: CrCl cannot be calculated (Unknown ideal weight.). Liver Function Tests: Recent Labs  Lab 11/10/2020 1100  AST 71*  ALT 34  ALKPHOS 61  BILITOT 0.8  PROT 6.2*  ALBUMIN 3.3*   No results for input(s): LIPASE, AMYLASE in the last 168 hours. Recent Labs  Lab 11/13/2020 1107  AMMONIA <10   Coagulation Profile: No results for input(s): INR, PROTIME in the last 168 hours. Cardiac Enzymes: No results for input(s): CKTOTAL, CKMB, CKMBINDEX, TROPONINI in the last 168 hours. BNP (last 3 results) No results for input(s): PROBNP in the last 8760 hours. HbA1C: No results for input(s): HGBA1C in the last 72 hours. CBG: No results for input(s): GLUCAP in the last 168 hours. Lipid Profile: No results for input(s): CHOL, HDL, LDLCALC, TRIG, CHOLHDL, LDLDIRECT in the last 72 hours. Thyroid Function Tests: No results for input(s): TSH, T4TOTAL, FREET4, T3FREE, THYROIDAB in the last 72 hours. Anemia Panel: No results for input(s): VITAMINB12, FOLATE, FERRITIN, TIBC, IRON, RETICCTPCT in the last 72 hours. Urine analysis:    Component Value Date/Time   COLORURINE YELLOW 11/15/2020 1532   APPEARANCEUR HAZY (A) 11/13/2020 1532   LABSPEC 1.016 11/07/2020 1532   PHURINE 5.0 11/28/2020 1532   GLUCOSEU NEGATIVE 11/30/2020 1532   HGBUR LARGE (A) 11/11/2020 1532   BILIRUBINUR NEGATIVE 11/19/2020 1532   KETONESUR 5 (A) 11/27/2020 1532   PROTEINUR 100 (A) 11/15/2020 1532   NITRITE NEGATIVE 11/30/2020 [facility] 11/28/2020 1532     Radiological Exams on Admission: CT HEAD WO CONTRAST (5MM)  Result Date: 11/19/2020 CLINICAL DATA:  Mental status change, unknown cause EXAM: CT HEAD WITHOUT CONTRAST TECHNIQUE: Contiguous axial images were obtained from the base of the skull through the vertex without intravenous contrast. COMPARISON:  None. FINDINGS: Brain: There is atrophy and chronic small vessel disease changes. No acute intracranial abnormality. Specifically, no hemorrhage, hydrocephalus, mass lesion, acute infarction, or significant intracranial injury. Vascular: No hyperdense vessel or unexpected calcification. Skull: No acute calvarial abnormality. Sinuses/Orbits: No acute findings Other: None IMPRESSION: Atrophy, chronic microvascular disease. No acute intracranial abnormality. Electronically Signed   By: Rolm Baptise M.D.   On: 11/03/2020 13:03   DG Chest Portable 1 View  Result Date: 11/29/2020 CLINICAL DATA:  Altered mental status. EXAM: PORTABLE CHEST 1 VIEW COMPARISON:  None. FINDINGS: The cardiac silhouette, mediastinal and hilar contours are within normal  limits. There is tortuosity and calcification of the thoracic aorta. Hyperinflation and emphysematous changes but no infiltrates, edema or effusions. No pulmonary lesions or pneumothorax. The bony thorax is intact. IMPRESSION: Emphysematous changes but no acute pulmonary findings. Electronically Signed   By: Marijo Sanes M.D.   On: 11/16/2020 11:03      Antonieta Pert MD [facility]  If 7PM-7AM, please contact night-coverage [email or web address]  11/15/2020, 5:15 PM

## 2020-11-12 NOTE — ED Notes (Signed)
Trop 129, notified Dr. Langston Masker.

## 2020-11-12 NOTE — ED Provider Notes (Signed)
[facility] EMERGENCY DEPARTMENT Provider Note   CSN: [id number] Arrival date & time: 12/01/2020  0941     History Chief Complaint  Patient presents with   Altered Mental Status   Fatigue    Tyler Liu is a 87 y.o. male with history of hypertension presenting from [facility] assisted [facility] with AMS and fatigue.  The patient cannot provide a reliable history due to fatigue and confusion.  Supplemental history was provided by his niece at bedside, who reports he is his next of kin.  His niece reports the patient has been a progressive decline for the past several months.  He has a very poor diet and refuses to eat or drink much every day.  He has been ambulatory as recently as last week, but today reportedly he was found on the toilet yelling for help.  He was unable to get up.  EMS brought the patient to the emergency department.  The patient is able to answer simple questions, denies to me that he has chest pain, headache, pain anywhere, or difficulty breathing.  He is not normally on oxygen.  His niece reports strong concerns from her and the patient's doctor that he is cachectic, has lost significant weight, and may be approaching end-of-life.  They have tried boost, Ensure, and supplemental drinks, but the patient often refuses these.  The patient does not currently have a DNR/DNI or advanced directive, but his niece reports he had repeatedly told her "I'm ready to die," and that he would not want aggressive or invasive end of life care.  She reports that he told her that his priority was "to stop going to the doctor."  Her niece denies to me the patient has any history of liver failure, COPD, oxygen use at home.  She states "other than his high blood pressure he was a pretty healthy guy when he moved here from [place] 4 years ago."  Niece Tyler Liu is next of kin, available by phone in emergency contacts.  HPI     Past Medical History:  Diagnosis Date    Hypertension     Patient Active Problem List   Diagnosis Date Noted   Generalized anxiety disorder 11/13/2020   Anemia of chronic disease 11/08/2020   Benign essential hypertension 12/75/1700   Acute metabolic encephalopathy 17/49/4496   AKI (acute kidney injury) ([place]) 11/16/2020   Transaminitis 11/02/2020   Adult failure to thrive syndrome 02/20/2014   Prostate cancer ([place]) 05/30/2013   Hearing loss 05/30/2013   CVA (cerebral vascular accident) ([place]) 05/30/2013    Past Surgical History:  Procedure Laterality Date   APPENDECTOMY     PROSTATECTOMY     TONSILLECTOMY         History reviewed. No pertinent family history.  Social History   Tobacco Use   Smoking status: Former   Smokeless tobacco: Never  Substance Use Topics   Alcohol use: Yes   Drug use: Never    Home Medications Prior to Admission medications   Medication Sig Start Date End Date Taking? Authorizing Provider  amLODipine (NORVASC) 5 MG tablet Take 5 mg by mouth daily.    [provider]  atenolol (TENORMIN) 25 MG tablet Take 25 mg by mouth 2 (two) times daily. 01/01/20   [provider]  Cholecalciferol (VITAMIN D3) 1000 units CAPS Take 1,000 Units by mouth daily.    [provider]  cloNIDine (CATAPRES) 0.1 MG tablet Take 0.1 mg by mouth as needed.  [provider]  Coenzyme Q10 60 MG TABS Take by mouth.    [provider]  cyclobenzaprine (FLEXERIL) 5 MG tablet Take 2 tablets (10 mg total) by mouth 2 (two) times daily as needed for up to 20 doses for muscle spasms. 12/01/19   Curatolo, Adam, DO  diazePAM (VALIUM PO) Take by mouth.    [provider]  GARLIC PO Take by mouth.    [provider]  Ginger, Zingiber officinalis, (GINGER ROOT PO) Take by mouth.    [provider]  methylPREDNISolone (MEDROL DOSEPAK) 4 MG TBPK tablet Follow package insert 12/01/19   Curatolo, Adam, DO  Multiple Vitamins-Minerals (ICAPS AREDS FORMULA  PO) Take by mouth.    [provider]  Omega 3 1200 MG CAPS Take by mouth.    [provider]  sertraline (ZOLOFT) 50 MG tablet Take 50 mg by mouth daily. 10/01/20   [provider]  vitamin B-12 (CYANOCOBALAMIN) 1000 MCG tablet Take 1,000 mcg by mouth daily.    [provider]    Allergies    Aspirin  Review of Systems   Review of Systems  Unable to perform ROS: Mental status change (level 5 caveat)   Physical Exam Updated Vital Signs BP (!) 132/43   Pulse 88   Temp 97.6 F (36.4 C) (Axillary)   Resp 15   SpO2 100%   Physical Exam Constitutional:      Comments: Cachetic, chronically malnourished appearing  HENT:     Head: Normocephalic and atraumatic.  Eyes:     Conjunctiva/sclera: Conjunctivae normal.     Pupils: Pupils are equal, round, and reactive to light.  Cardiovascular:     Rate and Rhythm: Normal rate and regular rhythm.  Pulmonary:     Effort: Pulmonary effort is normal. No respiratory distress.  Abdominal:     General: There is no distension.     Tenderness: There is no abdominal tenderness.  Skin:    General: Skin is warm and dry.  Neurological:     Mental Status: He is alert.     Comments: AAO x 2, speaking slowly    ED Results / Procedures / Treatments   Labs (all labs ordered are listed, but only abnormal results are displayed) Labs Reviewed  COMPREHENSIVE METABOLIC PANEL - Abnormal; Notable for the following components:      Result Value   Potassium 5.3 (*)    Glucose, Bld 108 (*)    BUN 80 (*)    Creatinine, Ser 2.10 (*)    Calcium 10.5 (*)    Total Protein 6.2 (*)    Albumin 3.3 (*)    AST 71 (*)    GFR, Estimated 28 (*)    All other components within normal limits  CBC WITH DIFFERENTIAL/PLATELET - Abnormal; Notable for the following components:   RBC 3.43 (*)    Hemoglobin 10.7 (*)    HCT 35.3 (*)    MCV 102.9 (*)    Neutro Abs 8.7 (*)    Lymphs Abs 0.5 (*)    Monocytes Absolute 1.2 (*)    All  other components within normal limits  URINALYSIS, ROUTINE W REFLEX MICROSCOPIC - Abnormal; Notable for the following components:   APPearance HAZY (*)    Hgb urine dipstick LARGE (*)    Ketones, ur 5 (*)    Protein, ur 100 (*)    RBC / HPF >50 (*)    Bacteria, UA RARE (*)    All other components  within normal limits  BRAIN NATRIURETIC PEPTIDE - Abnormal; Notable for the following components:   B Natriuretic Peptide 237.0 (*)    All other components within normal limits  I-STAT VENOUS BLOOD GAS, ED - Abnormal; Notable for the following components:   pCO2, Ven 63.5 (*)    Bicarbonate 33.1 (*)    TCO2 35 (*)    Acid-Base Excess 5.0 (*)    HCT 33.0 (*)    Hemoglobin 11.2 (*)    All other components within normal limits  TROPONIN I (HIGH SENSITIVITY) - Abnormal; Notable for the following components:   Troponin I (High Sensitivity) 129 (*)    All other components within normal limits  TROPONIN I (HIGH SENSITIVITY) - Abnormal; Notable for the following components:   Troponin I (High Sensitivity) 142 (*)    All other components within normal limits  RESP PANEL BY RT-PCR (FLU A&B, COVID) [id number]  AMMONIA  CBC  CREATININE, SERUM  CBC  COMPREHENSIVE METABOLIC PANEL    EKG EKG Interpretation  Date/Time:  Tuesday November 12 2020 09:52:05 EDT Ventricular Rate:  99 PR Interval:  205 QRS Duration: 90 QT Interval:  334 QTC Calculation: 429 R Axis:   86 Text Interpretation: Sinus rhythm Borderline right axis deviation Anteroseptal infarct, old No prior ecg for comparison Confirmed by Octaviano Glow [phone]) on 11/24/2020 10:37:35 AM  Radiology CT HEAD WO CONTRAST (5MM)  Result Date: 11/28/2020 CLINICAL DATA:  Mental status change, unknown cause EXAM: CT HEAD WITHOUT CONTRAST TECHNIQUE: Contiguous axial images were obtained from the base of the skull through the vertex without intravenous contrast. COMPARISON:  None. FINDINGS: Brain: There is atrophy and chronic small vessel disease  changes. No acute intracranial abnormality. Specifically, no hemorrhage, hydrocephalus, mass lesion, acute infarction, or significant intracranial injury. Vascular: No hyperdense vessel or unexpected calcification. Skull: No acute calvarial abnormality. Sinuses/Orbits: No acute findings Other: None IMPRESSION: Atrophy, chronic microvascular disease. No acute intracranial abnormality. Electronically Signed   By: Rolm Baptise M.D.   On: 11/17/2020 13:03   DG Chest Portable 1 View  Result Date: 11/27/2020 CLINICAL DATA:  Altered mental status. EXAM: PORTABLE CHEST 1 VIEW COMPARISON:  None. FINDINGS: The cardiac silhouette, mediastinal and hilar contours are within normal limits. There is tortuosity and calcification of the thoracic aorta. Hyperinflation and emphysematous changes but no infiltrates, edema or effusions. No pulmonary lesions or pneumothorax. The bony thorax is intact. IMPRESSION: Emphysematous changes but no acute pulmonary findings. Electronically Signed   By: Marijo Sanes M.D.   On: 11/20/2020 11:03    Procedures Procedures   Medications Ordered in ED Medications  enoxaparin (LOVENOX) injection 30 mg (has no administration in time range)  dextrose 5 %-0.9 % sodium chloride infusion (has no administration in time range)  acetaminophen (TYLENOL) tablet 650 mg (has no administration in time range)    Or  acetaminophen (TYLENOL) suppository 650 mg (has no administration in time range)  bisacodyl (DULCOLAX) suppository 10 mg (has no administration in time range)  ondansetron (ZOFRAN) tablet 4 mg (has no administration in time range)    Or  ondansetron (ZOFRAN) injection 4 mg (has no administration in time range)  sodium chloride 0.9 % bolus 500 mL (500 mLs Intravenous New Bag/Given 11/25/2020 1245)    ED Course  I have reviewed the triage vital signs and the nursing notes.  Pertinent labs & imaging results that were available during my care of the patient were reviewed by me and  considered in my medical decision making (see  chart for details).  Ddx for weakness, confusion includes infection vs arrhythmia vs anemia vs dehydration vs other  Supplemental hx provided by patient's niece at bedside Workup and labs reviewed, interpreted, with trops 140's (likely demand ischemia), flat on repeat, ECG without STEMI, covid negative, AKI with Cr 2.1, BUN 80, Alb 3.3, K 5.3, consistent with dehydration/AKI from poor PO intake.  Hgb stable.  CTH and dg chest reviewed and interpreted - no evident CVA or PNA.  Initially had no urine in bladder, after 500 cc IV fluid bolus we were able to obtain UA, which is pending.  Pt to be admitted to observation overnight for fluids and reassessment in AM, for home hospice if no improvement. It sounds like he has had a rapidly progressive cognitive decline the past few months per his family's report, and he is increasingly refusing to eat or drink anything.  Clinically from his cachectic appearance he appears to be approaching end of life.  He is DNR/DNI.  Clinical Course as of 11/06/2020 1801  Tue Nov 12, 2020  1037 EKG per my interpretation shows a sinus rhythm, borderline tachycardia, with possible old anterior infarct, no prior EKG for comparison, no ST elevations or acute ischemic findings otherwise [MT]  1142 Bladder scan with only 10 cc urine, in and out cath attempted with no urine output - possibly anuric (?) - awaiting CMP [MT]  1615 Admitted to hospitalist for observation overnight, plan to continue fluids and f/u on UA, if no change in mentation tomorrow or obvious cause of confusion emerges, he will likely be discharged with home hospice.  This is in keeping with his wishes per my discussion with his niece at bedside, who agrees with this plan for overnight stay.  She is aware that he may be critically ill, that if he continues to refuse to eat or drink, he would likely die within days of discharge home.  She does feel strongly this is in  keeping with his wishes to avoid aggressive hospitalization or intervention at end-of-life [MT]    Clinical Course User Index [MT] Langston Masker Carola Rhine, MD    Final Clinical Impression(s) / ED Diagnoses Final diagnoses:  AKI (acute kidney injury) ([place])  Altered mental status, unspecified altered mental status type  Dehydration  Elevated troponin    Rx / DC Orders ED Discharge Orders     None        Wyvonnia Dusky, MD 11/18/2020 [phone]

## 2020-11-13 DIAGNOSIS — E43 Unspecified severe protein-calorie malnutrition: Secondary | ICD-10-CM | POA: Diagnosis present

## 2020-11-13 DIAGNOSIS — Z515 Encounter for palliative care: Secondary | ICD-10-CM | POA: Diagnosis not present

## 2020-11-13 DIAGNOSIS — R627 Adult failure to thrive: Secondary | ICD-10-CM | POA: Diagnosis present

## 2020-11-13 DIAGNOSIS — I248 Other forms of acute ischemic heart disease: Secondary | ICD-10-CM | POA: Diagnosis present

## 2020-11-13 DIAGNOSIS — E785 Hyperlipidemia, unspecified: Secondary | ICD-10-CM | POA: Diagnosis present

## 2020-11-13 DIAGNOSIS — H9190 Unspecified hearing loss, unspecified ear: Secondary | ICD-10-CM | POA: Diagnosis present

## 2020-11-13 DIAGNOSIS — Z87891 Personal history of nicotine dependence: Secondary | ICD-10-CM | POA: Diagnosis not present

## 2020-11-13 DIAGNOSIS — Z8673 Personal history of transient ischemic attack (TIA), and cerebral infarction without residual deficits: Secondary | ICD-10-CM | POA: Diagnosis not present

## 2020-11-13 DIAGNOSIS — Z886 Allergy status to analgesic agent status: Secondary | ICD-10-CM | POA: Diagnosis not present

## 2020-11-13 DIAGNOSIS — F411 Generalized anxiety disorder: Secondary | ICD-10-CM | POA: Diagnosis present

## 2020-11-13 DIAGNOSIS — G9341 Metabolic encephalopathy: Secondary | ICD-10-CM | POA: Diagnosis present

## 2020-11-13 DIAGNOSIS — Z66 Do not resuscitate: Secondary | ICD-10-CM | POA: Diagnosis present

## 2020-11-13 DIAGNOSIS — E860 Dehydration: Secondary | ICD-10-CM | POA: Diagnosis present

## 2020-11-13 DIAGNOSIS — R778 Other specified abnormalities of plasma proteins: Secondary | ICD-10-CM | POA: Diagnosis present

## 2020-11-13 DIAGNOSIS — R64 Cachexia: Secondary | ICD-10-CM

## 2020-11-13 DIAGNOSIS — N179 Acute kidney failure, unspecified: Secondary | ICD-10-CM | POA: Diagnosis present

## 2020-11-13 DIAGNOSIS — Z79899 Other long term (current) drug therapy: Secondary | ICD-10-CM | POA: Diagnosis not present

## 2020-11-13 DIAGNOSIS — D638 Anemia in other chronic diseases classified elsewhere: Secondary | ICD-10-CM | POA: Diagnosis present

## 2020-11-13 DIAGNOSIS — Z20822 Contact with and (suspected) exposure to covid-19: Secondary | ICD-10-CM | POA: Diagnosis present

## 2020-11-13 DIAGNOSIS — R7401 Elevation of levels of liver transaminase levels: Secondary | ICD-10-CM | POA: Diagnosis present

## 2020-11-13 DIAGNOSIS — I252 Old myocardial infarction: Secondary | ICD-10-CM | POA: Diagnosis not present

## 2020-11-13 DIAGNOSIS — Z6820 Body mass index (BMI) 20.0-20.9, adult: Secondary | ICD-10-CM | POA: Diagnosis not present

## 2020-11-13 DIAGNOSIS — Z8546 Personal history of malignant neoplasm of prostate: Secondary | ICD-10-CM | POA: Diagnosis not present

## 2020-11-13 DIAGNOSIS — I10 Essential (primary) hypertension: Secondary | ICD-10-CM | POA: Diagnosis present

## 2020-11-13 NOTE — Discharge Summary (Signed)
Physician Discharge Summary  Tyler Liu DEY:[id number] DOB: 08-27-1922 DOA: 11/10/2020  PCP: Tyler Liu, No Pcp Per (Inactive)  Admit date: 11/26/2020 Discharge date: 11/13/2020  Time spent: 22 minutes  Recommendations for Outpatient Follow-up:  Discharging to free standing hospie facility  Discharge Diagnoses:  MAIN problem for hospitalization   Cachexia related to failure to thrive  Please see below for itemized issues addressed in [place]- refer to other progress notes for clarity if needed  Discharge Condition: gaurded  Diet recommendation: comfort  There were no vitals filed for this visit.  History of present illness:  87 yr male from home, living independently, Prior CVA, Prostate CA, Anxiety, htn, hld, anaemia Developed recent failure to thrive--not eating, drinking--found altered at home and yelling Unintelligible on admit Work-up revealed aki, ct head neg troponin elevated--he was unresponsive originally on admit  Family discussed with EDP and Tyler Liu would have wanted to not persue aggressive medical care Palltiive was consulted I spoke with niece personally--she cannot take him home for EOL care--she wishes a Hospice facility if possible in [facility]  He seems to have ~ 48 hours or more of longevity--so we will search fro a free standing hospice and continue comfort measures until then   Discharge Exam: Vitals:   11/11/2020 2200 11/13/20 0115  BP: (!) 130/42 (!) 149/47  Pulse: 90 69  Resp: 13 15  Temp:  (!) 97.5 F (36.4 C)  SpO2: 98%     Subj on day of d/c   Unresponsive slow sighing respirations pupils dilated Cannot ge tROS  General Exam on discharge  Dilated pupils Ct ab no added sound Very cachectic Abd soft Extremities warm Cannot assess neuro  Discharge Instructions   Discharge Instructions     Diet - low sodium heart healthy   Complete by: As directed    Increase activity slowly   Complete by: As directed    No wound  care   Complete by: As directed       Allergies as of 11/13/2020       Reactions   Aspirin         Medication List     STOP taking these medications    amLODipine 5 MG tablet Commonly known as: NORVASC   atenolol 25 MG tablet Commonly known as: TENORMIN   cloNIDine 0.1 MG tablet Commonly known as: CATAPRES   Coenzyme Q10 60 MG Tabs   cyclobenzaprine 5 MG tablet Commonly known as: FLEXERIL   GARLIC PO   GINGER ROOT PO   ICAPS AREDS FORMULA PO   methylPREDNISolone 4 MG Tbpk tablet Commonly known as: MEDROL DOSEPAK   Omega 3 1200 MG Caps   sertraline 50 MG tablet Commonly known as: ZOLOFT   VALIUM PO   vitamin B-12 1000 MCG tablet Commonly known as: CYANOCOBALAMIN   Vitamin D3 25 MCG (1000 UT) Caps       Allergies  Allergen Reactions   Aspirin       The results of significant diagnostics from this hospitalization (including imaging, microbiology, ancillary and laboratory) are listed below for reference.    Significant Diagnostic Studies: CT HEAD WO CONTRAST (5MM)  Result Date: 11/04/2020 CLINICAL DATA:  Mental status change, unknown cause EXAM: CT HEAD WITHOUT CONTRAST TECHNIQUE: Contiguous axial images were obtained from the base of the skull through the vertex without intravenous contrast. COMPARISON:  None. FINDINGS: Brain: There is atrophy and chronic small vessel disease changes. No acute intracranial abnormality. Specifically, no hemorrhage, hydrocephalus, mass lesion, acute infarction, or  significant intracranial injury. Vascular: No hyperdense vessel or unexpected calcification. Skull: No acute calvarial abnormality. Sinuses/Orbits: No acute findings Other: None IMPRESSION: Atrophy, chronic microvascular disease. No acute intracranial abnormality. Electronically Signed   By: Rolm Baptise M.D.   On: 11/13/2020 13:03   DG Chest Portable 1 View  Result Date: 11/11/2020 CLINICAL DATA:  Altered mental status. EXAM: PORTABLE CHEST 1 VIEW  COMPARISON:  None. FINDINGS: The cardiac silhouette, mediastinal and hilar contours are within normal limits. There is tortuosity and calcification of the thoracic aorta. Hyperinflation and emphysematous changes but no infiltrates, edema or effusions. No pulmonary lesions or pneumothorax. The bony thorax is intact. IMPRESSION: Emphysematous changes but no acute pulmonary findings. Electronically Signed   By: Marijo Sanes M.D.   On: 11/09/2020 11:03    Microbiology: Recent Results (from the past 240 hour(s))  Resp Panel by RT-PCR (Flu A&B, Covid) Nasopharyngeal Swab     Status: None   Collection Time: 11/29/2020 12:17 PM   Specimen: Nasopharyngeal Swab; Nasopharyngeal(NP) swabs in vial transport medium  Result Value Ref Range Status   SARS Coronavirus 2 by RT PCR NEGATIVE NEGATIVE Final    Comment: (NOTE) SARS-CoV-2 target nucleic acids are NOT DETECTED.  The SARS-CoV-2 RNA is generally detectable in upper respiratory specimens during the acute phase of infection. The lowest concentration of SARS-CoV-2 viral copies this assay can detect is 138 copies/mL. A negative result does not preclude SARS-Cov-2 infection and should not be used as the sole basis for treatment or other Tyler Liu management decisions. A negative result may occur with  improper specimen collection/handling, submission of specimen other than nasopharyngeal swab, presence of viral mutation(s) within the areas targeted by this assay, and inadequate number of viral copies(<138 copies/mL). A negative result must be combined with clinical observations, Tyler Liu history, and epidemiological information. The expected result is Negative.  Fact Sheet for Patients:  EntrepreneurPulse.com.au  Fact Sheet for Healthcare Providers:  IncredibleEmployment.be  This test is no t yet approved or cleared by the [place] FDA and  has been authorized for detection and/or diagnosis of SARS-CoV-2 by FDA  under an Emergency Use Authorization (EUA). This EUA will remain  in effect (meaning this test can be used) for the duration of the COVID-19 declaration under Section 564(b)(1) of the Act, 21 U.S.C.section 360bbb-3(b)(1), unless the authorization is terminated  or revoked sooner.       Influenza A by PCR NEGATIVE NEGATIVE Final   Influenza B by PCR NEGATIVE NEGATIVE Final    Comment: (NOTE) The Xpert Xpress SARS-CoV-2/FLU/RSV plus assay is intended as an aid in the diagnosis of influenza from Nasopharyngeal swab specimens and should not be used as a sole basis for treatment. Nasal washings and aspirates are unacceptable for Xpert Xpress SARS-CoV-2/FLU/RSV testing.  Fact Sheet for Patients: EntrepreneurPulse.com.au  Fact Sheet for Healthcare Providers: IncredibleEmployment.be  This test is not yet approved or cleared by the [place] FDA and has been authorized for detection and/or diagnosis of SARS-CoV-2 by FDA under an Emergency Use Authorization (EUA). This EUA will remain in effect (meaning this test can be used) for the duration of the COVID-19 declaration under Section 564(b)(1) of the Act, 21 U.S.C. section 360bbb-3(b)(1), unless the authorization is terminated or revoked.  Performed at [facility], [place] [address] [place], [place] [address]      Labs: Basic Metabolic Panel: Recent Labs  Lab 11/27/2020 1100 11/24/2020 1103  NA 144 142  K 5.3* 5.0  CL 103  --  CO2 26  --   GLUCOSE 108*  --   BUN 80*  --   CREATININE 2.10*  --   CALCIUM 10.5*  --    Liver Function Tests: Recent Labs  Lab 11/27/2020 1100  AST 71*  ALT 34  ALKPHOS 61  BILITOT 0.8  PROT 6.2*  ALBUMIN 3.3*   No results for input(s): LIPASE, AMYLASE in the last 168 hours. Recent Labs  Lab 11/28/2020 1107  AMMONIA <10   CBC: Recent Labs  Lab 11/06/2020 1100 11/24/2020 1103  WBC 10.5  --   NEUTROABS 8.7*  --   HGB 10.7* 11.2*  HCT 35.3* 33.0*   MCV 102.9*  --   PLT 175  --    Cardiac Enzymes: No results for input(s): CKTOTAL, CKMB, CKMBINDEX, TROPONINI in the last 168 hours. BNP: BNP (last 3 results) Recent Labs    11/07/2020 1100  BNP 237.0*    ProBNP (last 3 results) No results for input(s): PROBNP in the last 8760 hours.  CBG: No results for input(s): GLUCAP in the last 168 hours.     Signed:  Nita Sells MD   [facility] 11/13/2020, 8:53 AM

## 2020-11-13 NOTE — ED Notes (Signed)
Report called to [place], Therapist, sports.

## 2020-11-13 NOTE — TOC Initial Note (Signed)
Transition of Care [facility]) - Initial/Assessment Note    Patient Details  Name: Tyler Liu MRN: [id number] Date of Birth: 12/11/1922  [facility] CM/SW Contact:    Geralynn Ochs, LCSW Phone Number: 11/13/2020, 3:16 PM  Clinical Narrative:        CSW spoke with patient's niece, [name], to confirm interest in hospice facility in [place]. CSW spoke with admissions liaison with [facility] and faxed over referral paperwork. [facility] is evaluating for hospice approval, asking about any symptom management. CSW checked with RN and patient appears comfortable. Unsure if patient will qualify for hospice house without symptom management, but will try. Consents tentatively scheduled for completion at 9 am tomorrow and transport can be setup for noon tomorrow, if patient can get approved. CSW sent MD a message with barriers to discharge, and updated [name] on plan to hopefully move patient to hospice tomorrow. CSW to continue to follow.   Expected Discharge Plan: [place] Barriers to Discharge: Hospice Bed not available   Patient Goals and CMS Choice Patient states their goals for this hospitalization and ongoing recovery are:: patient unable to participate in goal setting CMS Medicare.gov Compare Post Acute Care list provided to:: Patient Represenative (must comment) Choice offered to / list presented to :  (niece)  Expected Discharge Plan and Services Expected Discharge Plan: [place]     Post Acute Care Choice: Hospice Living arrangements for the past 2 months: [place] Expected Discharge Date: 11/13/20                                    Prior Living Arrangements/Services Living arrangements for the past 2 months: [place] Lives with:: Facility Resident Patient language and need for interpreter reviewed:: No Do you feel safe going back to the place where you live?:  Yes      Need for Family Participation in Patient Care: Yes (Comment) Care giver support system in place?: No (comment)   Criminal Activity/Legal Involvement Pertinent to Current Situation/Hospitalization: No - Comment as needed  Activities of Daily Living      Permission Sought/Granted Permission sought to share information with : Facility Sport and exercise psychologist, Family Supports Permission granted to share information with : Yes, Verbal Permission Granted  Share Information with NAME: [name]  Permission granted to share info w AGENCY: Hospice  Permission granted to share info w Relationship: Niece     Emotional Assessment   Attitude/Demeanor/Rapport: Unable to Assess Affect (typically observed): Unable to Assess   Alcohol / Substance Use: Not Applicable Psych Involvement: No (comment)  Admission diagnosis:  Dehydration [E86.0] Elevated troponin [R77.8] AKI (acute kidney injury) ([place]) [N17.9] Altered mental status, unspecified altered mental status type [G99.24] Acute metabolic encephalopathy [Q68.34] Cachexia ([place]) [R64] Patient Active Problem List   Diagnosis Date Noted   Cachexia ([place]) 11/13/2020   Generalized anxiety disorder 11/15/2020   Anemia of chronic disease 11/09/2020   Benign essential hypertension 19/62/2297   Acute metabolic encephalopathy 98/92/1194   AKI (acute kidney injury) ([place]) 11/13/2020   Transaminitis 11/16/2020   Adult failure to thrive syndrome 02/20/2014   Prostate cancer ([place]) 05/30/2013   Hearing loss 05/30/2013   CVA (cerebral vascular accident) ([place]) 05/30/2013   PCP:  Patient, No Pcp Per (Inactive) Pharmacy:   [place], [place]  [place] [address] Phone: [phone] Fax: [phone]     Social Determinants of Health (SDOH) Interventions    Readmission Risk Interventions No flowsheet data found.

## 2020-11-13 NOTE — Progress Notes (Signed)
Daily Progress Note   Patient Name: Tyler Liu       Date: 11/13/2020 DOB: 06-29-1922  Age: 87 y.o. MRN#: [id number] Attending Physician: Nita Sells, MD Primary Care Physician: Patient, No Pcp Per (Inactive) Admit Date: 11/13/2020  Reason for Consultation/Follow-up: symptom check, end of life care  Subjective: Patient appears comfortable. Unresponsive to voice and light touch. No non-verbal signs of pain or discomfort noted. Respirations are even and unlabored. No excessive respiratory secretions noted.   Per Dr. Arlyss Queen note, niece/Tyler Liu has requested transfer to a hospice facility in [facility] if possible. I reached to [facility] and made sure they were aware.   1745 - I attempted to call niece/Tyler Liu with no answer. Secure voicemail left.    Length of Stay: 0  Current Medications:  PRN Meds: acetaminophen **OR** acetaminophen, antiseptic oral rinse, bisacodyl, glycopyrrolate **OR** glycopyrrolate **OR** glycopyrrolate, haloperidol **OR** haloperidol **OR** haloperidol lactate, HYDROmorphone (DILAUDID) injection, LORazepam **OR** LORazepam **OR** LORazepam, ondansetron **OR** ondansetron (ZOFRAN) IV, polyvinyl alcohol  Physical Exam Vitals reviewed.  Constitutional:      General: He is not in acute distress.    Appearance: He is cachectic. He is ill-appearing.  Pulmonary:     Effort: Pulmonary effort is normal.  Neurological:     Mental Status: He is unresponsive.            Vital Signs: BP (!) 149/47 (BP Location: Left Arm)   Pulse 69   Temp (!) 97.5 F (36.4 C) (Axillary)   Resp 15   SpO2 98%  SpO2: SpO2: 98 % O2 Device: O2 Device: Nasal Cannula O2 Flow Rate: O2 Flow Rate (L/min): 1 L/min  Intake/output summary:  Intake/Output Summary (Last 24 hours) at  11/13/2020 1111 Last data filed at 11/13/2020 0700 Gross per 24 hour  Intake 0 ml  Output 200 ml  Net -200 ml     Palliative Assessment/Data: PPS 10%     Palliative Care Assessment & Plan   HPI/Patient Profile: 87 y.o. male  with past medical history of CVA, history of prostate cancer, anxiety, anemia, HTN, and hearing loss who was brought to the emergency department on 11/08/2020 with altered mental status, weakness, and failure to thrive. Today he was found as his assisted living facility on the toilet yelling for help, unable to get up, and confused from baseline.  In the ED, labs are significant for creatinine of 2.10 and elevated troponin of 129>142. Head CT with no acute finding. Chest x-ray with emphysematous changes. Admitted to [facility] with acute metabolic encephalopathy.    Assessment: - acute metabolic encephalopathy -  acute kidney injury - cachexia, failure to thrive - end of life care  Recommendations/Plan: Continue comfort measures Niece wants patient transferred to a hospice facility in [place] aware PRN medications are available for symptom management at EOL PMT will continue to follow  Goals of Care and Additional Recommendations: Limitations on Scope of Treatment: Full Comfort Care  Code Status: DNR/DNI   Prognosis:  < 2 weeks  Discharge Planning: To Be Determined    Thank you for allowing the Palliative Medicine Team to assist in the care of this patient.   Total Time 15 minutes Prolonged Time Billed  no       Greater than 50%  of this time was spent counseling and coordinating care related to the above assessment and plan.  Lavena Bullion, NP  Please contact Palliative Medicine Team phone at [phone] for questions and concerns.

## 2020-11-13 NOTE — Plan of Care (Signed)

## 2020-11-13 NOTE — Progress Notes (Signed)
New Admission Note:   Arrival Method: Arrived from [facility]  ED via stretcher Mental Orientation:Patient nonverbal Telemetry: N/A Assessment: Completed Skin: See doc flowsheet IV: NSL-RtUA Pain: 0/10 Tubes: N/A Safety Measures:Siderails up x3. Bed alarm turned on.  Admission: Patient nonverbal,no family member available 5MW Orientation: .  Family: None at bedside  Orders have been reviewed and implemented. Will continue to monitor the patient. Call light has been placed within reach and bed alarm has been activated.   Jalena Vanderlinden [employer], RN-BC Phone number: [phone]

## 2020-11-14 MED ORDER — ORAL CARE MOUTH RINSE: 15.0000 mL | Freq: Two times a day (BID) | OROMUCOSAL | Status: DC

## 2020-12-03 NOTE — Plan of Care (Signed)
  Problem: Pain Managment: Goal: General experience of comfort will improve Outcome: Progressing   

## 2020-12-03 NOTE — Progress Notes (Signed)
2 RN's pronounced patient dead at 9:50 am.  MD notified.   Spoke with niece [name], expressed condolences and she will return call back with information for future arrangements.  Aurther Loft, RN

## 2020-12-03 NOTE — Progress Notes (Signed)
Seen examined--some moaning and discomfort--medicated with Dilaudid  Patient stabilized for d/c to Hospice facility whenever appropriate and can be arranged  Verneita Griffes, MD [facility] 8:32 AM

## 2020-12-03 NOTE — TOC Transition Note (Signed)
Transition of Care [facility]) - CM/SW Discharge Note   Patient Details  Name: Tyler Liu MRN: [id number] Date of Birth: 1922-08-07  Transition of Care [facility]) CM/SW Contact:  Geralynn Ochs, LCSW Phone Number: 2020-11-27, 10:04 AM   Clinical Narrative:    CSW worked towards discharging the patient to hospice today, scheduled transportation for 11:00am to the [employer]. CSW then notified by RN that patient has passed away. CSW canceled transport and notified hospice liaison, who is with the patient's niece. No additional needs at this time.    Final next level of care: Expired Barriers to Discharge: Barriers Resolved   Patient Goals and CMS Choice Patient states their goals for this hospitalization and ongoing recovery are:: patient unable to participate in goal setting CMS Medicare.gov Compare Post Acute Care list provided to:: Patient Represenative (must comment) Choice offered to / list presented to :  (niece)  Discharge Placement                       Discharge Plan and Services     Post Acute Care Choice: Hospice                               Social Determinants of Health (SDOH) Interventions     Readmission Risk Interventions No flowsheet data found.

## 2020-12-03 NOTE — Discharge Summary (Signed)
Death Summary  Tyler Liu XTG:[id number] DOB: 1922/11/28 DOA: 12-09-20  PCP: Patient, No Pcp Per (Inactive)  Admit date: 09-Dec-2020 Date of Death: 11-Dec-2020 Time of Death: 10 AM Notification: Patient, No Pcp Per (Inactive) notified of death of 12/11/2020   History of present illness:  Tyler Liu is a 87 y.o. male with a history of  Fahim Hildreth presented with complaint of poor appetite and failure to thrive  87 yr male from home, living independently, Prior CVA, Prostate CA, Anxiety, htn, hld, anaemia Developed recent failure to thrive--not eating, drinking--found altered at home and yelling Unintelligible on admit Work-up revealed aki, ct head neg troponin elevated--he was unresponsive originally on admit   Family discussed with EDP and patient would have wanted to not persue aggressive medical care Palltiive was consulted  I spoke with niece personally--family was discussed with on 10/12 and they could not manage patient at home for end-of-life care they were requesting a hospice facility. It was felt that patient had at least 48 to 72 hours of longevity however patient expired at 10 AM on December 11, 2020.  Condolences were expressed to family and paperwork was signed.   Final Diagnoses:  1.   Adult failure to thrive   The results of significant diagnostics from this hospitalization (including imaging, microbiology, ancillary and laboratory) are listed below for reference.    Significant Diagnostic Studies: CT HEAD WO CONTRAST (5MM)  Result Date: 2020-12-09 CLINICAL DATA:  Mental status change, unknown cause EXAM: CT HEAD WITHOUT CONTRAST TECHNIQUE: Contiguous axial images were obtained from the base of the skull through the vertex without intravenous contrast. COMPARISON:  None. FINDINGS: Brain: There is atrophy and chronic small vessel disease changes. No acute intracranial abnormality. Specifically, no hemorrhage, hydrocephalus, mass lesion, acute infarction, or  significant intracranial injury. Vascular: No hyperdense vessel or unexpected calcification. Skull: No acute calvarial abnormality. Sinuses/Orbits: No acute findings Other: None IMPRESSION: Atrophy, chronic microvascular disease. No acute intracranial abnormality. Electronically Signed   By: Rolm Baptise M.D.   On: 12-09-20 13:03   DG Chest Portable 1 View  Result Date: Dec 09, 2020 CLINICAL DATA:  Altered mental status. EXAM: PORTABLE CHEST 1 VIEW COMPARISON:  None. FINDINGS: The cardiac silhouette, mediastinal and hilar contours are within normal limits. There is tortuosity and calcification of the thoracic aorta. Hyperinflation and emphysematous changes but no infiltrates, edema or effusions. No pulmonary lesions or pneumothorax. The bony thorax is intact. IMPRESSION: Emphysematous changes but no acute pulmonary findings. Electronically Signed   By: Marijo Sanes M.D.   On: Dec 09, 2020 11:03    Microbiology: Recent Results (from the past 240 hour(s))  Resp Panel by RT-PCR (Flu A&B, Covid) Nasopharyngeal Swab     Status: None   Collection Time: 12-09-2020 12:17 PM   Specimen: Nasopharyngeal Swab; Nasopharyngeal(NP) swabs in vial transport medium  Result Value Ref Range Status   SARS Coronavirus 2 by RT PCR NEGATIVE NEGATIVE Final    Comment: (NOTE) SARS-CoV-2 target nucleic acids are NOT DETECTED.  The SARS-CoV-2 RNA is generally detectable in upper respiratory specimens during the acute phase of infection. The lowest concentration of SARS-CoV-2 viral copies this assay can detect is 138 copies/mL. A negative result does not preclude SARS-Cov-2 infection and should not be used as the sole basis for treatment or other patient management decisions. A negative result may occur with  improper specimen collection/handling, submission of specimen other than nasopharyngeal swab, presence of viral mutation(s) within the areas targeted by this assay, and inadequate number of viral copies(<138  copies/mL).  A negative result must be combined with clinical observations, patient history, and epidemiological information. The expected result is Negative.  Fact Sheet for Patients:  EntrepreneurPulse.com.au  Fact Sheet for Healthcare Providers:  IncredibleEmployment.be  This test is no t yet approved or cleared by the [place] FDA and  has been authorized for detection and/or diagnosis of SARS-CoV-2 by FDA under an Emergency Use Authorization (EUA). This EUA will remain  in effect (meaning this test can be used) for the duration of the COVID-19 declaration under Section 564(b)(1) of the Act, 21 U.S.C.section 360bbb-3(b)(1), unless the authorization is terminated  or revoked sooner.       Influenza A by PCR NEGATIVE NEGATIVE Final   Influenza B by PCR NEGATIVE NEGATIVE Final    Comment: (NOTE) The Xpert Xpress SARS-CoV-2/FLU/RSV plus assay is intended as an aid in the diagnosis of influenza from Nasopharyngeal swab specimens and should not be used as a sole basis for treatment. Nasal washings and aspirates are unacceptable for Xpert Xpress SARS-CoV-2/FLU/RSV testing.  Fact Sheet for Patients: EntrepreneurPulse.com.au  Fact Sheet for Healthcare Providers: IncredibleEmployment.be  This test is not yet approved or cleared by the [place] FDA and has been authorized for detection and/or diagnosis of SARS-CoV-2 by FDA under an Emergency Use Authorization (EUA). This EUA will remain in effect (meaning this test can be used) for the duration of the COVID-19 declaration under Section 564(b)(1) of the Act, 21 U.S.C. section 360bbb-3(b)(1), unless the authorization is terminated or revoked.  Performed at [facility], [place] [address] [place], [place] [address]      Labs: Basic Metabolic Panel: Recent Labs  Lab 11/26/2020 1100 12/02/2020 1103  NA 144 142  K 5.3* 5.0  CL 103  --   CO2 26   --   GLUCOSE 108*  --   BUN 80*  --   CREATININE 2.10*  --   CALCIUM 10.5*  --    Liver Function Tests: Recent Labs  Lab 12/02/2020 1100  AST 71*  ALT 34  ALKPHOS 61  BILITOT 0.8  PROT 6.2*  ALBUMIN 3.3*   No results for input(s): LIPASE, AMYLASE in the last 168 hours. Recent Labs  Lab 11/11/2020 1107  AMMONIA <10   CBC: Recent Labs  Lab 11/28/2020 1100 11/09/2020 1103  WBC 10.5  --   NEUTROABS 8.7*  --   HGB 10.7* 11.2*  HCT 35.3* 33.0*  MCV 102.9*  --   PLT 175  --    Cardiac Enzymes: No results for input(s): CKTOTAL, CKMB, CKMBINDEX, TROPONINI in the last 168 hours. D-Dimer No results for input(s): DDIMER in the last 72 hours. BNP: Invalid input(s): POCBNP CBG: No results for input(s): GLUCAP in the last 168 hours. Anemia work up No results for input(s): VITAMINB12, FOLATE, FERRITIN, TIBC, IRON, RETICCTPCT in the last 72 hours. Urinalysis    Component Value Date/Time   COLORURINE YELLOW 11/11/2020 1532   APPEARANCEUR HAZY (A) 11/26/2020 1532   LABSPEC 1.016 11/27/2020 1532   PHURINE 5.0 11/21/2020 1532   GLUCOSEU NEGATIVE 11/17/2020 1532   HGBUR LARGE (A) 11/22/2020 1532   BILIRUBINUR NEGATIVE 11/28/2020 1532   KETONESUR 5 (A) 11/23/2020 1532   PROTEINUR 100 (A) 11/30/2020 1532   NITRITE NEGATIVE 11/18/2020 1532   LEUKOCYTESUR NEGATIVE 11/29/2020 1532   Sepsis Labs Invalid input(s): PROCALCITONIN,  WBC,  LACTICIDVEN     SIGNED:  Nita Sells, MD  [facility] 11-28-2020, 9:56 AM Pager   If 7PM-7AM, please contact night-coverage [email or web address] Password  [email or web address]

## 2020-12-03 DEATH — deceased

## 2022-07-24 IMAGING — CT CT RENAL STONE PROTOCOL
2 of 4 series · 15 of 46 positions shown, 17 images · non-contrast
Comparison: CT lumbar spine same date.

CLINICAL DATA: Flank pain, suspected kidney stone with RIGHT flank
pain

EXAM:
CT ABDOMEN AND PELVIS WITHOUT CONTRAST
TECHNIQUE: Multidetector CT imaging of the abdomen and pelvis was performed
following the standard protocol without IV contrast.

[Series 2: axial st · axial · 0.78mm/px · z∈[+616,+956]mm · 12 of 78 slices shown, 14 images]
[im 5/78  soft-tissue]
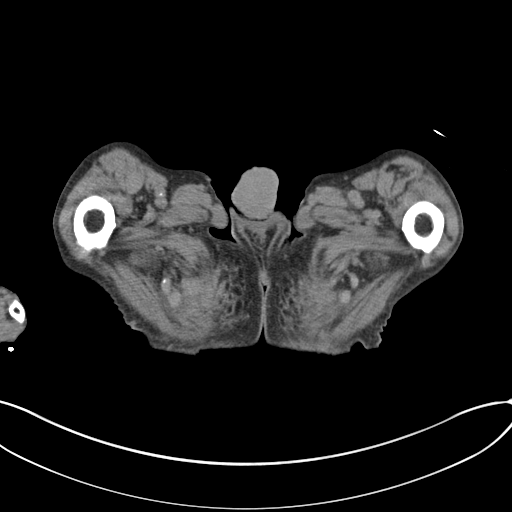
[im 5/78  bone]
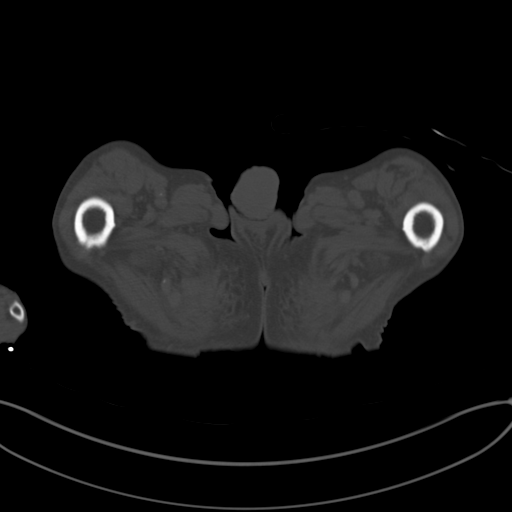
[im 13/78  soft-tissue]
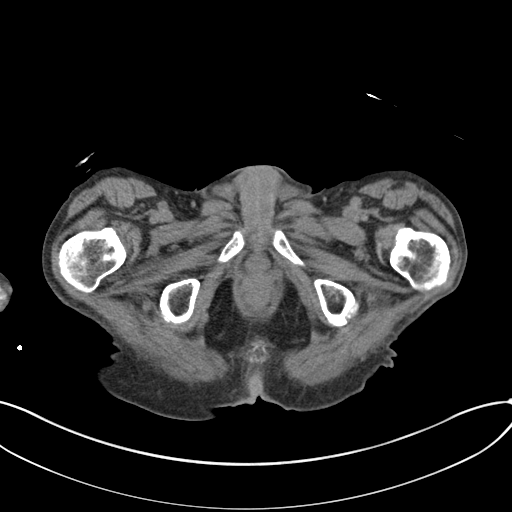
[im 18/78  soft-tissue]
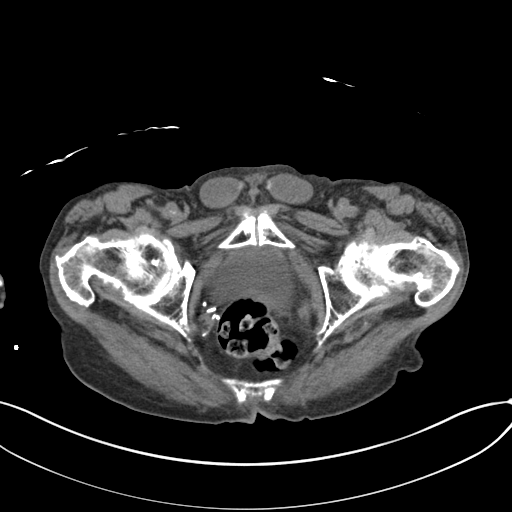
[im 22/78  soft-tissue]
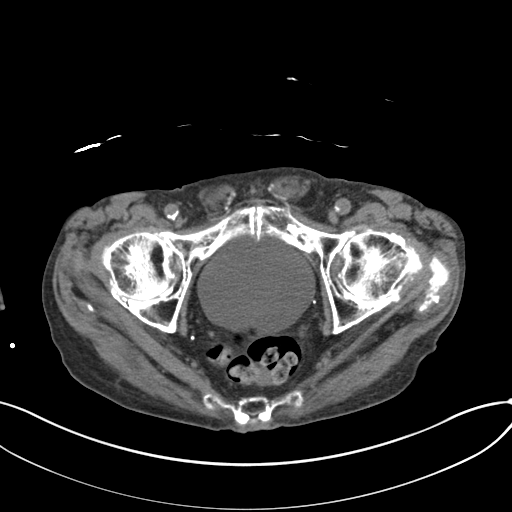
[im 30/78  soft-tissue]
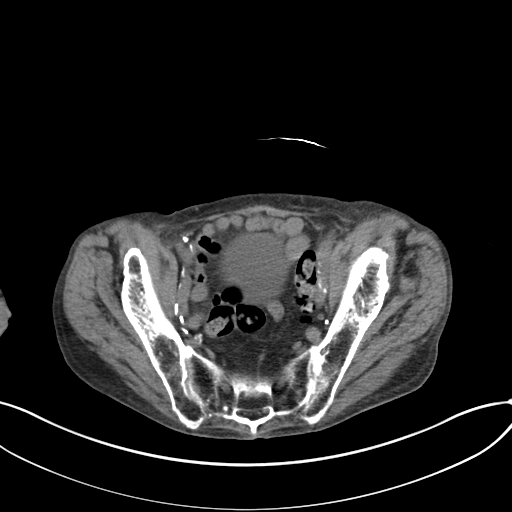
[im 35/78  soft-tissue]
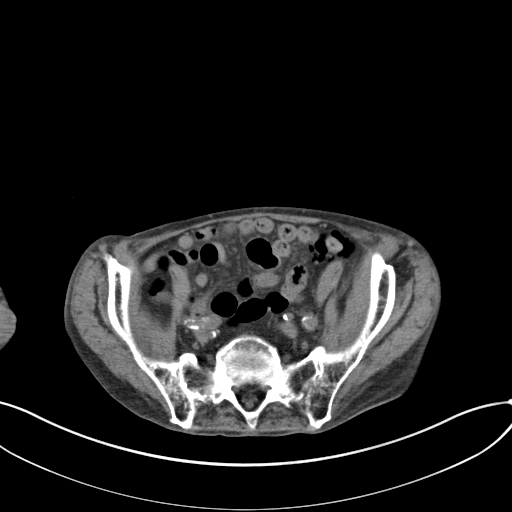
[im 43/78  soft-tissue]
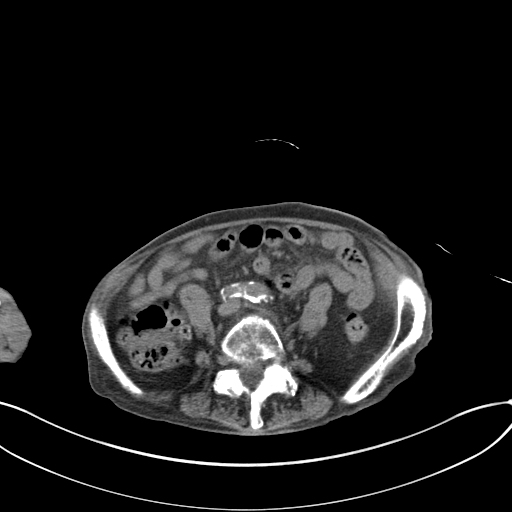
[im 48/78  soft-tissue]
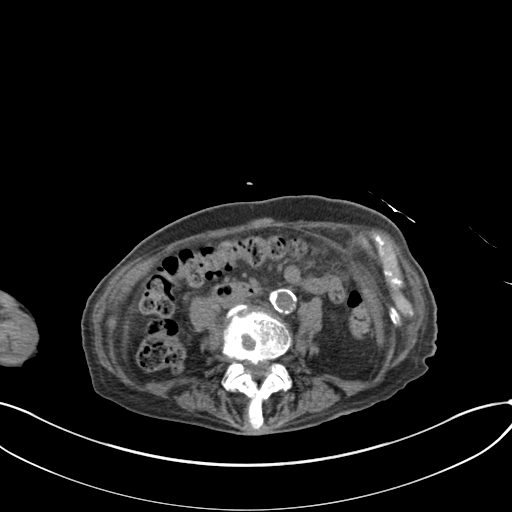
[im 56/78  soft-tissue]
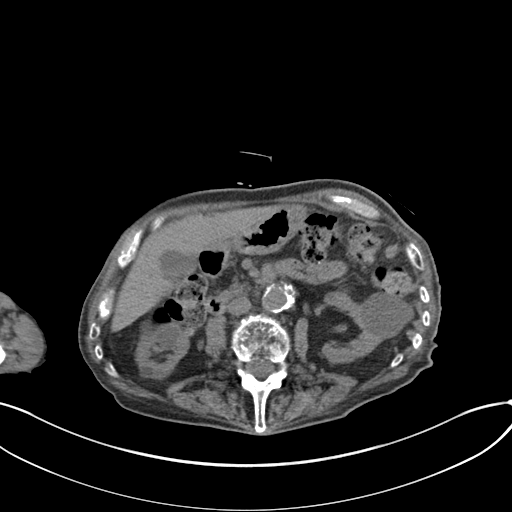
[im 56/78  bone]
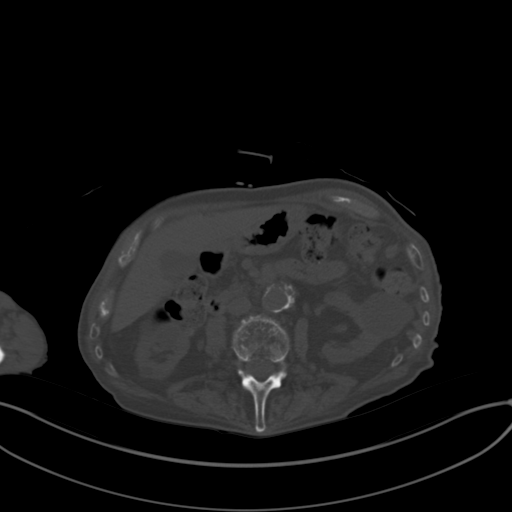
[im 60/78  soft-tissue]
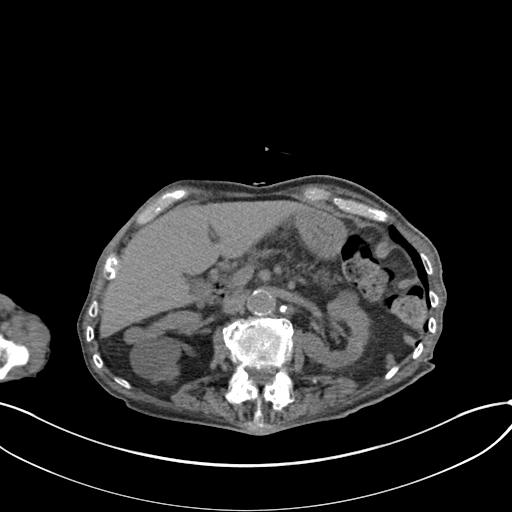
[im 65/78  soft-tissue]
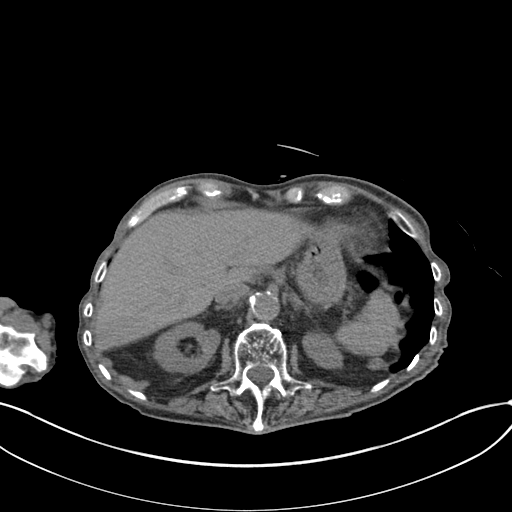
[im 73/78  soft-tissue]
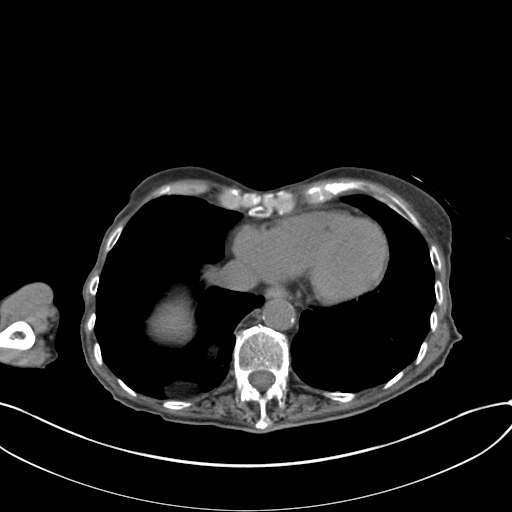

[Series 4: coronal · coronal · 0.65mm/px · 3 of 124 slices shown]
[im 42/124  soft-tissue]
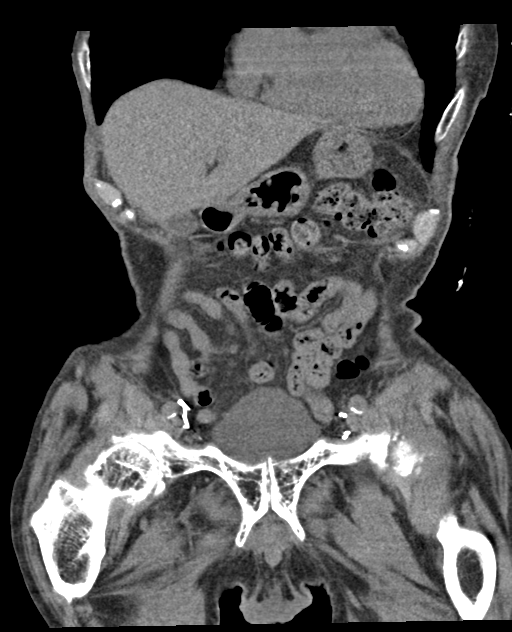
[im 55/124  soft-tissue]
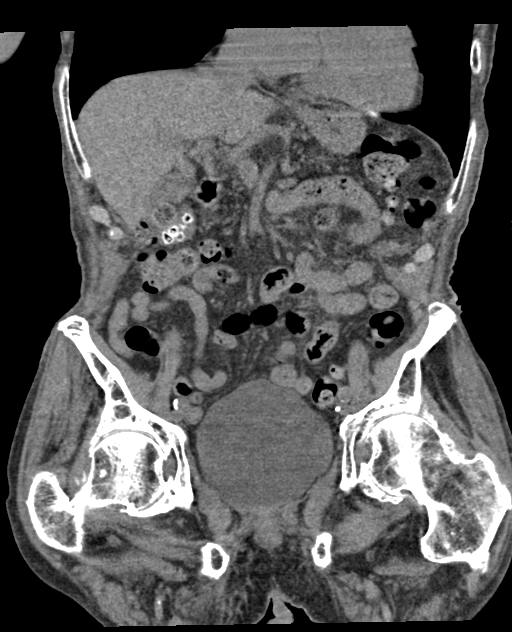
[im 69/124  soft-tissue]
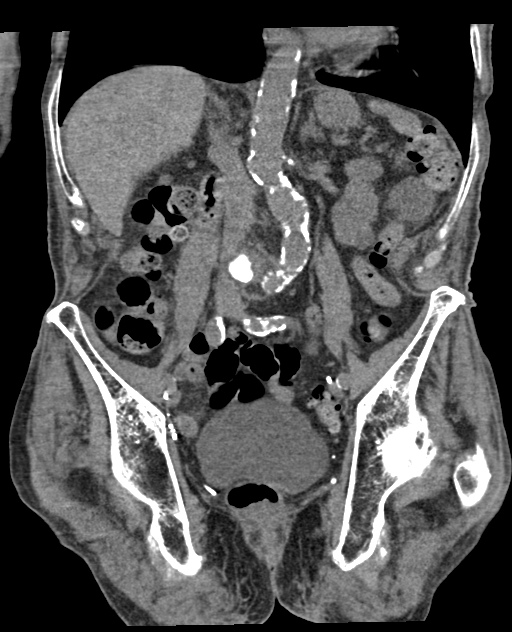

[15 of 46 positions shown; findings below may reference images not displayed]

FINDINGS: Lower chest: Bilateral Bochdalek's hernias with adjacent
atelectasis. No consolidation. No pleural effusion.

Hepatobiliary: Normal noncontrast appearance of liver.
Cholelithiasis without pericholecystic stranding. Biliary tree not
well evaluated, no gross biliary duct distension.

Pancreas: Fatty atrophy of much of the pancreas without signs of
inflammation. Atrophy is present throughout the entire pancreas. No
visible ductal dilation.

Spleen: Spleen is normal.

Adrenals/Urinary Tract: Adrenal glands are normal.

Cystic lesions of the bilateral kidneys most displaying density
compatible with simple cysts. The lobulated lesion along the lateral
margin of the RIGHT kidney measures 13-14 mm with density just below
30 Hounsfield units, homogeneous density.

No sign of hydronephrosis. No perinephric stranding. Urinary bladder
moderately distended.

Lack of ureteral distension makes ureteral assessment difficult. No
gross evidence of ureteral calculi and no secondary signs to suggest
ureteral calculi.

Stomach/Bowel: Signs of colonic diverticulosis. Mild stranding about
the RIGHT colon in the setting of RIGHT-sided diverticulosis.
Appendix not visualized, surgically absent by report.

Vascular/Lymphatic: Calcified atheromatous plaque of the abdominal
aorta. The 2.7 cm caliber of the infrarenal abdominal aorta is
slightly greater than 1.5 times the normal caliber abdominal aorta.
Not well evaluated in the absence of contrast. There is no
gastrohepatic or hepatoduodenal ligament lymphadenopathy. No
retroperitoneal or mesenteric lymphadenopathy.

No pelvic sidewall lymphadenopathy.

Reproductive: Post prostatectomy. Post pelvic lymphadenectomy. No
pelvic mass.

Other: Mild stranding overlying the sacrum. No CT evidence of
ulceration (image 55 of series 2)

Musculoskeletal: Marked osteopenia. Spinal degenerative changes. No
acute or destructive bone process.
IMPRESSION: 1. Colonic diverticulosis including RIGHT-sided diverticular
disease. Query low level inflammatory changes of the RIGHT colon
which could represent mild diverticulitis.
2. No hydronephrosis or nephrolithiasis. No periureteral stranding
to suggest ureteral calculi. Limited assessment due to difficulty
visualizing the course of the ureter the setting of post
prostatectomy changes and lack of retroperitoneal fat.
3. Cholelithiasis without evidence of acute cholecystitis.
4. Cystic lesions of the bilateral kidneys most displaying density
compatible with simple cysts. The lobulated lesion along the lateral
margin of the RIGHT kidney measures 13-14 mm with density just below
30 Hounsfield units, homogeneous density. Follow-up renal sonogram
may be helpful for further evaluation in 3-6 months.
5. Mild stranding overlying the sacrum. No CT evidence of
ulceration. Correlate with any symptoms of sacral decubitus ulcer.
6. Infrarenal abdominal aorta dilated to 2.7 cm as described.
Recommend follow-up every 5 years. This recommendation follows ACR
consensus guidelines: White Paper of the ACR Incidental Findings
Committee II on Vascular Findings. [HOSPITAL] 8212;
[DATE].
7. Aortic atherosclerosis.

Aortic Atherosclerosis ([id number]-[id number].O).
